# Patient Record
Sex: Male | Born: 1985 | Race: White | Hispanic: No | Smoking: Never smoker
Health system: Southern US, Community
[De-identification: ages and names within clinical notes are randomized; demographics above are authoritative.]

## PROBLEM LIST (undated history)

## (undated) DIAGNOSIS — F419 Anxiety disorder, unspecified: Secondary | ICD-10-CM

## (undated) DIAGNOSIS — E291 Testicular hypofunction: Secondary | ICD-10-CM

## (undated) HISTORY — PX: WISDOM TOOTH EXTRACTION: SHX21

## (undated) HISTORY — DX: Anxiety disorder, unspecified: F41.9

## (undated) HISTORY — PX: VASECTOMY: SHX75

## (undated) HISTORY — DX: Testicular hypofunction: E29.1

---

## 2008-04-24 ENCOUNTER — Encounter: Admission: RE | Admit: 2008-04-24 | Discharge: 2008-04-24 | Payer: Self-pay | Admitting: Internal Medicine

## 2017-08-13 ENCOUNTER — Ambulatory Visit (INDEPENDENT_AMBULATORY_CARE_PROVIDER_SITE_OTHER): Payer: PRIVATE HEALTH INSURANCE | Admitting: Endocrinology

## 2017-08-13 ENCOUNTER — Encounter: Payer: Self-pay | Admitting: Endocrinology

## 2017-08-13 DIAGNOSIS — E291 Testicular hypofunction: Secondary | ICD-10-CM

## 2017-08-13 DIAGNOSIS — N62 Hypertrophy of breast: Secondary | ICD-10-CM

## 2017-08-13 DIAGNOSIS — R7989 Other specified abnormal findings of blood chemistry: Secondary | ICD-10-CM | POA: Diagnosis not present

## 2017-08-13 LAB — IBC PANEL
IRON: 94 ug/dL (ref 42–165)
SATURATION RATIOS: 24.5 % (ref 20.0–50.0)
TRANSFERRIN: 274 mg/dL (ref 212.0–360.0)

## 2017-08-13 LAB — TSH: TSH: 1.07 u[IU]/mL (ref 0.35–4.50)

## 2017-08-13 LAB — LUTEINIZING HORMONE: LH: 2.92 m[IU]/mL (ref 1.50–9.30)

## 2017-08-13 NOTE — Patient Instructions (Addendum)
blood tests are requested for you today.  We'll let you know about the results. Testosterone treatment has risks, including increased or decreased fertility (depending on the type of treatment), hair loss, prostate cancer, benign prostate enlargement, blood clots, liver problems, lower hdl ("good cholesterol"), polycythemia (opposite of anemia), sleep apnea, and behavior changes.  Therefore, we have to weigh the risks and benefits of treatment.

## 2017-08-13 NOTE — Progress Notes (Signed)
Subjective:    Patient ID: Joshua Tanner, male    DOB: 01/15/1986, 31 y.o.   MRN: [id number]  HPI Pt is referred by Dr Dimas AguasHoward, for hypogonadism.  Pt reports he had puberty at the normal age.  He has 3 biological children.  He says he has never taken illicit androgens, but he once took "natural body builder" product.  In 2013, he was was noted to have testosterone of 200, and was ref to endocrinology.  The practice declined to see him, due to h/o bodybuilding.  He has not taken this product since then.  He has never been on any prescribed medication for hypogonadism.  He does not take antiandrogens or opioids.  He denies any h/o infertility, XRT, or genital infection.  He has never had surgery, or a serious injury to the head or genital area. He has no h/o sleep apnea or DVT.   He does not consume alcohol excessively.  He has moderate fatigue, and assoc mild depression.  He says he once weighed 325 lbs. He has had a vasectomy.   Past Medical History:  Diagnosis Date  . Hypogonadism in male     No past surgical history on file.  Social History   Socioeconomic History  . Marital status: Unknown    Spouse name: Not on file  . Number of children: Not on file  . Years of education: Not on file  . Highest education level: Not on file  Social Needs  . Financial resource strain: Not on file  . Food insecurity - worry: Not on file  . Food insecurity - inability: Not on file  . Transportation needs - medical: Not on file  . Transportation needs - non-medical: Not on file  Occupational History  . Not on file  Tobacco Use  . Smoking status: Never Smoker  . Smokeless tobacco: Current User    Types: Chew  Substance and Sexual Activity  . Alcohol use: Yes  . Drug use: No  . Sexual activity: Yes  Other Topics Concern  . Not on file  Social History Narrative  . Not on file    No current outpatient medications on file prior to visit.   No current facility-administered medications on file  prior to visit.     Allergies  Allergen Reactions  . Latex Rash    Rash & itching  . Codeine     Family History  Problem Relation Age of Onset  . Other Neg Hx        low testosterone    BP 118/86 (BP Location: Left Arm, Patient Position: Sitting, Cuff Size: Normal)   Pulse (!) 56   Wt 223 lb 12.8 oz (101.5 kg)   SpO2 98%     Review of Systems denies numbness, erectile dysfunction, decreased urinary stream, muscle weakness, fever, headache, easy bruising, sob, rash, diplopia, rhinorrhea, chest pain.  He reports decreased libido, gynecomastia, intermitt aggressive behavior, weight gain, and insomnia.       Objective:   Physical Exam VS: see vs page GEN: no distress HEAD: head: no deformity eyes: no periorbital swelling, no proptosis external nose and ears are normal mouth: no lesion seen NECK: supple, thyroid is not enlarged CHEST WALL: no deformity LUNGS: clear to auscultation BREASTS:  Mild bilat pseudogynecomastia CV: reg rate and rhythm, no murmur ABD: abdomen is soft, nontender.  no hepatosplenomegaly.  not distended.  no hernia GENITALIA:  Normal male.   MUSCULOSKELETAL: muscle bulk and strength are grossly normal.  no obvious joint swelling.  gait is normal and steady EXTEMITIES: no leg edema PULSES: no carotid bruit NEURO:  cn 2-12 grossly intact.   readily moves all 4's.  sensation is intact to touch on all 4's SKIN:  Normal texture and temperature.  No rash or suspicious lesion is visible.  Normal hair distribution NODES:  None palpable at the neck PSYCH: alert, well-oriented.  Does not appear anxious nor depressed.  I have reviewed outside records, and summarized: Pt was noted to have low testosterone, and referred here.  He was seen for wellness visit, and he was otherwise in good health.     Assessment & Plan:  Hypogonadism, new, uncertain etiology   Patient Instructions  blood tests are requested for you today.  We'll let you know about the  results. Testosterone treatment has risks, including increased or decreased fertility (depending on the type of treatment), hair loss, prostate cancer, benign prostate enlargement, blood clots, liver problems, lower hdl ("good cholesterol"), polycythemia (opposite of anemia), sleep apnea, and behavior changes.  Therefore, we have to weigh the risks and benefits of treatment.

## 2017-08-14 LAB — PROLACTIN: Prolactin: 5.6 ng/mL (ref 2.0–18.0)

## 2017-08-15 ENCOUNTER — Encounter: Payer: Self-pay | Admitting: Endocrinology

## 2017-08-15 DIAGNOSIS — E291 Testicular hypofunction: Secondary | ICD-10-CM | POA: Insufficient documentation

## 2017-08-15 LAB — TESTOSTERONE,FREE AND TOTAL
TESTOSTERONE FREE: 9.1 pg/mL (ref 8.7–25.1)
TESTOSTERONE: 407 ng/dL (ref 264–916)

## 2017-08-19 ENCOUNTER — Telehealth: Payer: Self-pay | Admitting: Endocrinology

## 2017-08-19 LAB — ESTRADIOL, FREE
Estradiol, Free: 0.31 pg/mL
Estradiol: 19 pg/mL

## 2017-08-19 NOTE — Telephone Encounter (Signed)
Patient is calling for the results of lab work

## 2017-08-20 ENCOUNTER — Encounter: Payer: Self-pay | Admitting: Endocrinology

## 2017-08-20 NOTE — Telephone Encounter (Signed)
Results were completed at almost midnight last night.  All are normal--good.  I hope you feel well.

## 2017-08-20 NOTE — Telephone Encounter (Signed)
Patient's wife keeps calling. The results haven't been released from last week that I see? Please advise?

## 2017-08-20 NOTE — Telephone Encounter (Signed)
The testosterone can fluctuate, but I am glad it is normal.  You should conclude that your symptoms are not from the testosterone.

## 2017-08-20 NOTE — Telephone Encounter (Signed)
Patient is concerned & doesn't understand the fluctuation in the tests. He would like to be advised on that & how is values could change so rapidly?

## 2017-08-20 NOTE — Telephone Encounter (Signed)
Malachi BondsGloria (wife) is concerned. Patient not feeling very well and they are still waiting for Lab results (not on MyChart yet). They are waiting for the results so Dr can treat patient. Please call ph# [phone] to let them know Lab results

## 2017-08-21 NOTE — Telephone Encounter (Signed)
Called patient & he wasn't please with the answer that he received. He said this is the second endocrinologist who doesn't want to help him or help him understand the fluctuations in his testosterone. He said that the will be seeking out a referral elsewhere.

## 2017-12-06 ENCOUNTER — Emergency Department (HOSPITAL_COMMUNITY)
Admission: EM | Admit: 2017-12-06 | Discharge: 2017-12-06 | Disposition: A | Discharge status: Home / Self Care | Payer: PRIVATE HEALTH INSURANCE | Attending: Emergency Medicine | Admitting: Emergency Medicine

## 2017-12-06 ENCOUNTER — Other Ambulatory Visit: Payer: Self-pay

## 2017-12-06 ENCOUNTER — Emergency Department (HOSPITAL_COMMUNITY): Payer: PRIVATE HEALTH INSURANCE

## 2017-12-06 ENCOUNTER — Encounter (HOSPITAL_COMMUNITY): Payer: Self-pay

## 2017-12-06 DIAGNOSIS — R1032 Left lower quadrant pain: Secondary | ICD-10-CM | POA: Diagnosis not present

## 2017-12-06 DIAGNOSIS — Z91040 Latex allergy status: Secondary | ICD-10-CM | POA: Diagnosis not present

## 2017-12-06 DIAGNOSIS — E860 Dehydration: Secondary | ICD-10-CM | POA: Diagnosis not present

## 2017-12-06 DIAGNOSIS — R1030 Lower abdominal pain, unspecified: Secondary | ICD-10-CM

## 2017-12-06 LAB — COMPREHENSIVE METABOLIC PANEL
ALT: 18 U/L (ref 17–63)
AST: 21 U/L (ref 15–41)
Albumin: 4.5 g/dL (ref 3.5–5.0)
Alkaline Phosphatase: 50 U/L (ref 38–126)
Anion gap: 10 (ref 5–15)
BILIRUBIN TOTAL: 0.8 mg/dL (ref 0.3–1.2)
BUN: 14 mg/dL (ref 6–20)
CHLORIDE: 101 mmol/L (ref 101–111)
CO2: 26 mmol/L (ref 22–32)
CREATININE: 1.52 mg/dL — AB (ref 0.61–1.24)
Calcium: 9.6 mg/dL (ref 8.9–10.3)
GFR calc Af Amer: >60 mL/min (ref >60)
GFR calc non Af Amer: 59 mL/min — ABNORMAL LOW (ref >60)
GLUCOSE: 83 mg/dL (ref 65–99)
Potassium: 4.8 mmol/L (ref 3.5–5.1)
Sodium: 137 mmol/L (ref 135–145)
Total Protein: 7 g/dL (ref 6.5–8.1)

## 2017-12-06 LAB — CBC WITH DIFFERENTIAL/PLATELET
BASOS ABS: 0 10*3/uL (ref 0.0–0.1)
Basophils Relative: 0 %
Eosinophils Absolute: 0 10*3/uL (ref 0.0–0.7)
Eosinophils Relative: 0 %
HEMATOCRIT: 44.2 % (ref 39.0–52.0)
Hemoglobin: 15.5 g/dL (ref 13.0–17.0)
LYMPHS PCT: 25 %
Lymphs Abs: 2 10*3/uL (ref 0.7–4.0)
MCH: 31.6 pg (ref 26.0–34.0)
MCHC: 35.1 g/dL (ref 30.0–36.0)
MCV: 90.2 fL (ref 78.0–100.0)
Monocytes Absolute: 0.7 10*3/uL (ref 0.1–1.0)
Monocytes Relative: 9 %
NEUTROS ABS: 5.2 10*3/uL (ref 1.7–7.7)
Neutrophils Relative %: 66 %
PLATELETS: 220 10*3/uL (ref 150–400)
RBC: 4.9 MIL/uL (ref 4.22–5.81)
RDW: 12.5 % (ref 11.5–15.5)
WBC: 7.9 10*3/uL (ref 4.0–10.5)

## 2017-12-06 LAB — URINALYSIS, ROUTINE W REFLEX MICROSCOPIC
Bacteria, UA: NONE SEEN
Bilirubin Urine: NEGATIVE
Glucose, UA: NEGATIVE mg/dL
Hgb urine dipstick: NEGATIVE
Ketones, ur: 5 mg/dL — AB
Leukocytes, UA: NEGATIVE
Nitrite: NEGATIVE
Protein, ur: 30 mg/dL — AB
Specific Gravity, Urine: 1.03 (ref 1.005–1.030)
pH: 5 (ref 5.0–8.0)

## 2017-12-06 LAB — LIPASE, BLOOD: Lipase: 28 U/L (ref 11–51)

## 2017-12-06 MED ORDER — SODIUM CHLORIDE 0.9 % IV BOLUS (SEPSIS): 1000.0000 mL | Freq: Once | INTRAVENOUS | Status: DC

## 2017-12-06 NOTE — ED Triage Notes (Signed)
PT to US

## 2017-12-06 NOTE — Discharge Instructions (Signed)
Continue with your antibiotics.  Take pain medication as needed.  Drink plenty of fluid.  Return if your condition worsen or if you have other concerns.

## 2017-12-06 NOTE — ED Provider Notes (Signed)
[facility] Provider Note   CSN: [id number] Arrival date & time: 12/06/17  1120     History   Chief Complaint Chief Complaint  Patient presents with  . Abdominal Pain    HPI Joshua Tanner is a [age] y.o. male.  HPI   32 year old male presenting for evaluation of abdominal pain.  Patient report for the past 4 days he has had progressive worsening lower abdominal pain.  He described pain as a stabbing sharp sensation that comes in waves and has become progressively worse.  Pain is worse last night while he was trying to sleep.  He noticed increasing pain after eating.  Endorsed nausea without vomiting.  He denies associated fever, chills, chest pain, shortness of breath, productive cough, dysuria, hematuria, penile discharge, or testicular pain.  He did mention the pain does sometimes radiates to his back.  He was initially seen by his PCP 3 days ago for his pain.  He was given antibiotic for potential diverticulitis without any extensive workup at that time.  Patient was prescribed Bactrim, and Flagyl.  He has been taking the medication without relief.  [facility] for his symptoms.  He mentioned that blood work and CT scan came back without any acute finding.  He was discharged home with Norco.  He did call reach out his PCP who recommended coming to the ER for further evaluation since his pain still persist.  Patient did mention noticing his urine having a darker color, and some odor.  But denies burning while urinating.  Patient otherwise without any significant past medical history.  Past Medical History:  Diagnosis Date  . Hypogonadism in male     Patient Active Problem List   Diagnosis Date Noted  . Hypogonadism in male   . Low testosterone 08/13/2017  . Gynecomastia 08/13/2017    History reviewed. No pertinent surgical history.     Home Medications    Prior to Admission medications   Not on  File    Family History Family History  Problem Relation Age of Onset  . Other Neg Hx        low testosterone    Social History Social History   Tobacco Use  . Smoking status: Never Smoker  . Smokeless tobacco: Current User    Types: Chew  Substance Use Topics  . Alcohol use: Yes  . Drug use: No     Allergies   Latex and Codeine   Review of Systems Review of Systems  All other systems reviewed and are negative.    Physical Exam Updated Vital Signs BP (!) 141/95 (BP Location: Right Arm)   Pulse 76   Temp 98.4 F (36.9 C) (Oral)   Resp 20   Ht 6\' 1"  (1.854 m)   Wt 99.8 kg (220 lb)   SpO2 100%   BMI 29.03 kg/m   Physical Exam  Constitutional: He appears well-developed and well-nourished. No distress.  HENT:  Head: Atraumatic.  Mouth/Throat: Oropharynx is clear and moist.  Eyes: Conjunctivae are normal.  Neck: Neck supple.  Cardiovascular: Regular rhythm.  Pulmonary/Chest: Effort normal and breath sounds normal. No respiratory distress. He exhibits no tenderness.  Abdominal: Soft. Normal appearance. There is tenderness in the left lower quadrant. There is no rebound, no guarding, no tenderness at McBurney's point and negative Murphy's sign. No hernia. Hernia confirmed negative in the right inguinal area and confirmed negative in the left inguinal area.  Genitourinary: Testes normal and penis normal. Cremasteric reflex is present. Right testis shows no swelling and no tenderness. Left testis shows no swelling and no tenderness.  Neurological: He is alert.  Skin: No rash noted.  Psychiatric: He has a normal mood and affect.  Nursing note and vitals reviewed.    ED Treatments / Results  Labs (all labs ordered are listed, but only abnormal results are displayed) Labs Reviewed  URINALYSIS, ROUTINE W REFLEX MICROSCOPIC - Abnormal; Notable for the following components:      Result Value   Color, Urine AMBER (*)    APPearance HAZY (*)    Ketones, ur 5 (*)      Protein, ur 30 (*)    Squamous Epithelial / LPF 0-5 (*)    All other components within normal limits  COMPREHENSIVE METABOLIC PANEL - Abnormal; Notable for the following components:   Creatinine, Ser 1.52 (*)    GFR calc non Af Amer 59 (*)    All other components within normal limits  CBC WITH DIFFERENTIAL/PLATELET  LIPASE, BLOOD    EKG  EKG Interpretation None       Radiology US Abdomen Limited  Result Date: 12/06/2017 CLINICAL DATA:  Acute right upper quadrant abdominal pain. EXAM: ULTRASOUND ABDOMEN LIMITED RIGHT UPPER QUADRANT COMPARISON:  None. FINDINGS: Gallbladder: No gallstones or wall thickening visualized. No sonographic Murphy sign noted by sonographer. Common bile duct: Diameter: 2 mm which is within normal limits. Liver: No focal lesion identified. Within normal limits in parenchymal echogenicity. Portal vein is patent on color Doppler imaging with normal direction of blood flow towards the liver. IMPRESSION: No abnormality seen in the right upper quadrant of the abdomen. Electronically Signed   By: Lupita Raider, M.D.   On: 12/06/2017 14:48    Procedures Procedures (including critical care time)  EMERGENCY DEPARTMENT  US GUIDANCE EXAM Emergency Ultrasound:  US Guidance for Needle Guidance  INDICATIONS: Difficult vascular access Linear probe used in real-time to visualize location of needle entry through skin.   PERFORMED BY: Myself IMAGES ARCHIVED?: No LIMITATIONS: Pain VIEWS USED: Transverse INTERPRETATION: Needle visualized within vein, Right arm and Needle gauge 20  Medications Ordered in ED Medications - No data to display   Initial Impression / Assessment and Plan / ED Course  I have reviewed the triage vital signs and the nursing notes.  Pertinent labs & imaging results that were available during my care of the patient were reviewed by me and considered in my medical decision making (see chart for details).     BP (!) 141/92   Pulse 76    Temp 98.4 F (36.9 C) (Oral)   Resp 20   Ht 6\' 1"  (1.854 m)   Wt 99.8 kg (220 lb)   SpO2 100%   BMI 29.03 kg/m    Final Clinical Impressions(s) / ED Diagnoses   Final diagnoses:  Lower abdominal pain  Dehydration    ED Discharge Orders    None     1:08 PM Patient here with lower abdominal pain for the past 4 days.  States that he was seen at an outside hospital for this condition early this morning, had blood work that was unremarkable and abdominal pelvic CT scan without any acute finding.  He is here at the recommendation of his PCP for further evaluation of his condition.  He was recently provided prophylactic treatment for potential diverticulitis which include Bactrim, and Flagyl.  He was also prescribed pain medication when discharged this a.m.  On exam patient does have some left lower quadrant tenderness without guarding.  No evidence of testicular pain or torsion noted.  No signs of hernia.  His UA shows no evidence of blood in the urine or signs of urinary tract infection.  He is afebrile, vital signs stable.  Plan to obtain records from outside hospital before determining plan of action.   1:52 PM Unable to access note from outside facility because they are closed today.  Will obtain labs, along with limited abdominal ultrasound since patient voiced concern for potential gallbladder disease and states that his family has a strong history of gallbladder problem.  If ultrasound negative, will consider abdominal pelvis CT scan with contrast to further management.  3:18 PM Last some mostly reassuring.  Creatinine is 1.52, urine shows 5 ketone.  This finding suggestive of mild dehydration.  I did offer IV fluids but patient prefers p.o. fluid instead.  Abdominal ultrasound without any acute concerning feature.  In the setting of renal impairment, I believe it is more harmful to obtain a CT with contrast.  After discussion with patient, patient felt comfortable going home continue  with current antibiotic treatment as well as pain medication that was previously prescribed.  He understands to return if his condition worsen and otherwise stable for discharge.   Fayrene Helper, PA-C 12/06/17 1520    Rolland Porter, MD 12/07/17 [phone]

## 2017-12-06 NOTE — ED Triage Notes (Signed)
Pt reports RLQ and LLQ pain that began on Friday where he was seen by PCP. Last night pain became so severe that he was doubled over in pain and [facility]. Pt states they did blood work and CT that came back normal. Pt states he spoke with PCP this morning who instructed him to come here for UA, US, hida scan. Denies vomiting or diarrhea. Last BM yesterday

## 2017-12-09 ENCOUNTER — Ambulatory Visit (INDEPENDENT_AMBULATORY_CARE_PROVIDER_SITE_OTHER): Payer: PRIVATE HEALTH INSURANCE | Admitting: Gastroenterology

## 2017-12-09 ENCOUNTER — Encounter: Payer: Self-pay | Admitting: Gastroenterology

## 2017-12-09 DIAGNOSIS — R109 Unspecified abdominal pain: Secondary | ICD-10-CM | POA: Insufficient documentation

## 2017-12-09 NOTE — Patient Instructions (Signed)
I have given you probiotic samples to take for the next 2-4 weeks. You can get probiotics over the counter such as digestive advantage, Philip's Colon Health, Align, and Restora (the last 2 are more expensive).  If pain recurs, call me. We will do further evaluation.  It was good to see you both!  It was a pleasure to see you today. I strive to create trusting relationships with patients to provide genuine, compassionate, and quality care. I value your feedback. If you receive a survey regarding your visit,  I greatly appreciate you taking time to fill this out.   Anna W. Boone, PhD, Gelene MinkANP-BC [facility]

## 2017-12-09 NOTE — Progress Notes (Signed)
Primary Care Physician:  Selinda Flavin, MD Primary Gastroenterologist:  Dr. Jena Gauss   Chief Complaint  Patient presents with  . Abdominal Pain    epigastric and below x last thurs, had soreness yesterday  . Nausea    no vomiting    HPI:   Joshua Tanner is a 32 y.o. male presenting today as a self referral due to abdominal pain. He was seen at PCP's office and prescribed empiric antibiotics for possible diverticulitis, with recommendations to seek emergent care if worsening. He went to [place] over the weekend and underwent a CT without contrast, which was unrevealing. Discharged from [facility] and presented to [facility] with unrevealing ultrasound and labs. Creatinine 1.52 on 12/06/17.   States last Thursday evening, he felt nauseated. Friday morning went to work and had worsening nausea, felt like he was coming down with a stomach bug. Saturday evening: [facility], Sunday morning: [facility]. Sunday evening, felt like he had undergone an extensive ab workout. Tuesday, sore. Nausea returned, laid down. Last few days have been better. Yesterday and today without any pain and able to eat absolutely everything. Nausea resolved. Last dose of antibiotics this evening. Pain was located at umbilicus to lower abdomen but progressed to diffuse midline pain entire abdomen. Eating made this worse. No NSAIDs. Has chronic lower back pain. No reflux. Rare burping. Feels gassy today and with looser stool but overall improved.     Past Medical History:  Diagnosis Date  . Anxiety   . Hypogonadism in male     Past Surgical History:  Procedure Laterality Date  . VASECTOMY    . WISDOM TOOTH EXTRACTION      Current Outpatient Medications  Medication Sig Dispense Refill  . buPROPion (WELLBUTRIN XL) 150 MG 24 hr tablet Take 150 mg by mouth daily.    Marland Kitchen HYDROcodone-acetaminophen (NORCO) 7.5-325 MG tablet Take 1 tablet by mouth 4 (four) times daily as needed for moderate pain.    . meclizine (ANTIVERT) 25 MG tablet  Take 25 mg by mouth 2 (two) times daily.    . metroNIDAZOLE (FLAGYL) 500 MG tablet Take 500 mg by mouth 2 (two) times daily.    Marland Kitchen sulfamethoxazole-trimethoprim (BACTRIM DS,SEPTRA DS) 800-160 MG tablet Take 1 tablet by mouth 2 (two) times daily.     No current facility-administered medications for this visit.     Allergies as of 12/09/2017 - Review Complete 12/09/2017  Allergen Reaction Noted  . Latex Rash 08/13/2017  . Codeine  08/13/2017    Family History  Problem Relation Age of Onset  . Other Neg Hx        low testosterone  . Colon cancer Neg Hx     Social History   Socioeconomic History  . Marital status: Unknown    Spouse name: Not on file  . Number of children: Not on file  . Years of education: Not on file  . Highest education level: Not on file  Social Needs  . Financial resource strain: Not on file  . Food insecurity - worry: Not on file  . Food insecurity - inability: Not on file  . Transportation needs - medical: Not on file  . Transportation needs - non-medical: Not on file  Occupational History  . Not on file  Tobacco Use  . Smoking status: Never Smoker  . Smokeless tobacco: Current User    Types: Chew  Substance and Sexual Activity  . Alcohol use: Yes    Comment: several beers in evenings on days off  .  Drug use: No  . Sexual activity: Yes  Other Topics Concern  . Not on file  Social History Narrative  . Not on file    Review of Systems: Gen: Denies any fever, chills, fatigue, weight loss, lack of appetite.  CV: Denies chest pain, heart palpitations, peripheral edema, syncope.  Resp: Denies shortness of breath at rest or with exertion. Denies wheezing or cough.  GI: see HPI  GU : Denies urinary burning, urinary frequency, urinary hesitancy MS: Denies joint pain, muscle weakness, cramps, or limitation of movement.  Derm: Denies rash, itching, dry skin Psych: Denies depression, anxiety, memory loss, and confusion Heme: Denies bruising, bleeding,  and enlarged lymph nodes.  Physical Exam: BP (!) 141/84   Pulse 73   Temp (!) 97.1 F (36.2 C) (Oral)   Ht 6\' 1"  (1.854 m)   Wt 228 lb (103.4 kg)   BMI 30.08 kg/m  General:   Alert and oriented. Pleasant and cooperative. Well-nourished and well-developed.  Head:  Normocephalic and atraumatic. Eyes:  Without icterus, sclera clear and conjunctiva pink.  Ears:  Normal auditory acuity. Mouth:  No deformity or lesions, oral mucosa pink.  Neck:  Supple, without mass or thyromegaly. Lungs:  Clear to auscultation bilaterally. No wheezes, rales, or rhonchi. No distress.  Heart:  S1, S2 present without murmurs appreciated.  Abdomen:  +BS, soft, non-tender and non-distended. No HSM noted. No guarding or rebound. No masses appreciated.  Rectal:  Deferred  Msk:  Symmetrical without gross deformities. Normal posture. Extremities:  Without edema. Neurologic:  Alert and  oriented x4 Psych:  Alert and cooperative. Normal mood and affect.  Lab Results  Component Value Date   WBC 7.9 12/06/2017   HGB 15.5 12/06/2017   HCT 44.2 12/06/2017   MCV 90.2 12/06/2017   PLT 220 12/06/2017   Lab Results  Component Value Date   ALT 18 12/06/2017   AST 21 12/06/2017   ALKPHOS 50 12/06/2017   BILITOT 0.8 12/06/2017   Lab Results  Component Value Date   CREATININE 1.52 (H) 12/06/2017   BUN 14 12/06/2017   NA 137 12/06/2017   K 4.8 12/06/2017   CL 101 12/06/2017   CO2 26 12/06/2017

## 2017-12-09 NOTE — Progress Notes (Signed)
CC'ED TO PCP 

## 2017-12-09 NOTE — Assessment & Plan Note (Signed)
32 year old male with acute onset of lower abdominal pain last week, associated nausea, with unrevealing CT (without contrast), labs, US abdomen. Pain now resolved. Empirically started on treatment for diverticulitis last week prior to [facility], and he will be finishing this course tonight. I doubt dealing with diverticulitis and low likelihood of biliary etiology. No significant symptoms such as chronic GERD or dyspepsia. If recurrent symptoms, he is to call us, and we will pursue either HIDA or CT with contrast depending on symptoms. Needs to have creatinine evaluated again with PCP due to bump, and this was normal in Jan 2019. Advised to follow-up with PCP regarding this. Call if any further issues. Samples of probiotics provided due to recent antibiotics. Return prn.

## 2018-12-30 ENCOUNTER — Encounter (INDEPENDENT_AMBULATORY_CARE_PROVIDER_SITE_OTHER): Payer: Self-pay | Admitting: Internal Medicine

## 2019-05-14 IMAGING — US US ABDOMEN LIMITED
1 series · 14 of 25 positions shown · non-contrast
Comparison: None.

CLINICAL DATA: Acute right upper quadrant abdominal pain.

EXAM:
ULTRASOUND ABDOMEN LIMITED RIGHT UPPER QUADRANT

[Series 1: us abdomen limited · 0.26mm/px · 14 of 29 slices shown]
[im 1/29]
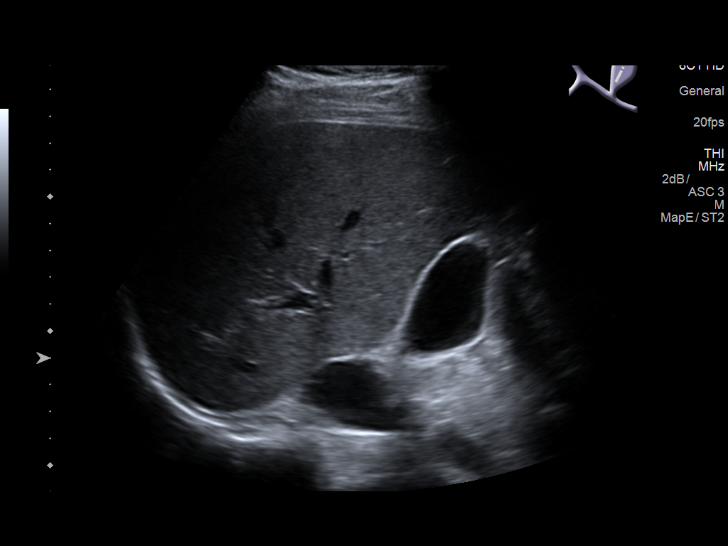
[im 3/29]
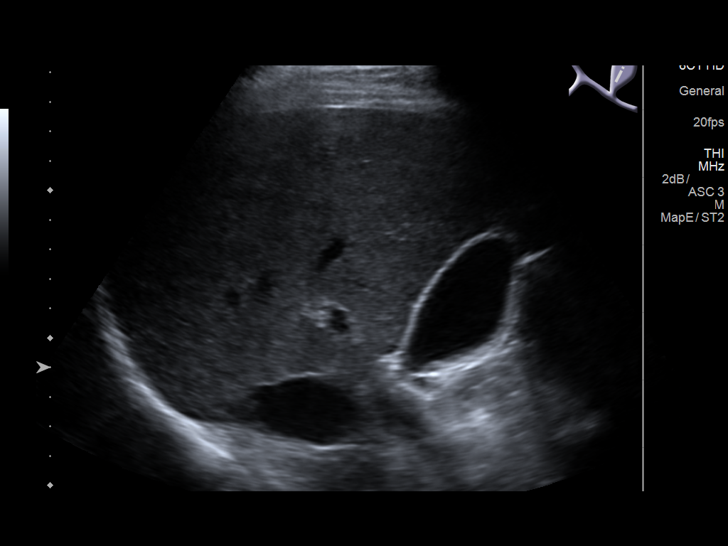
[im 5/29]
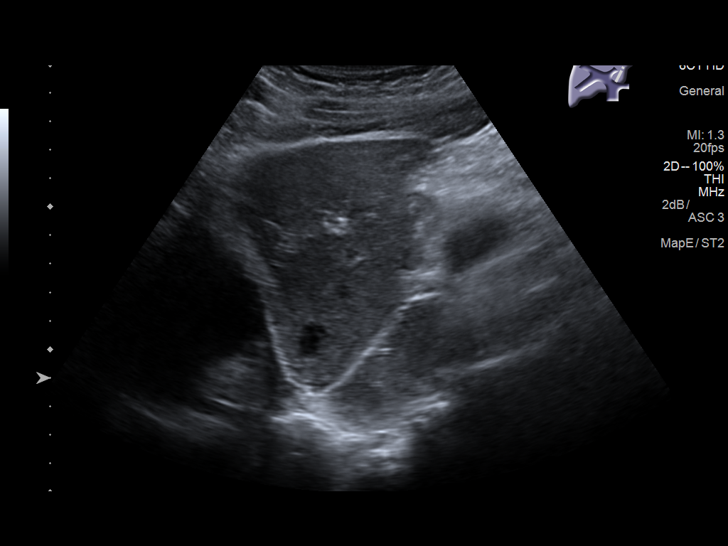
[im 8/29]
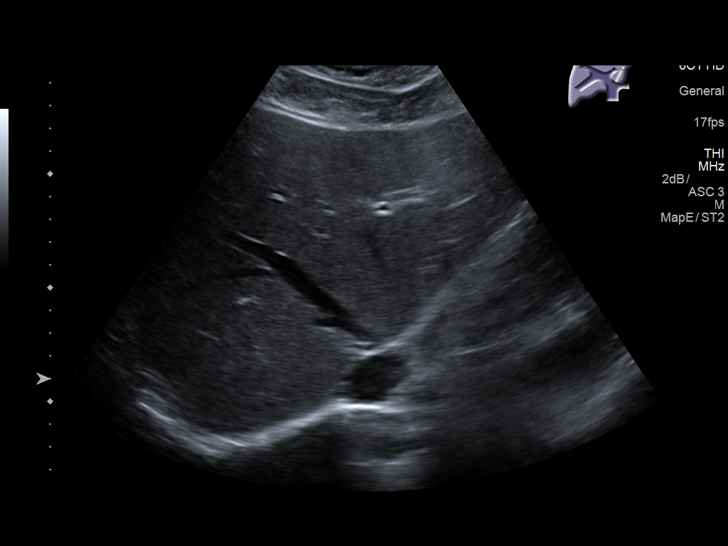
[im 10/29]
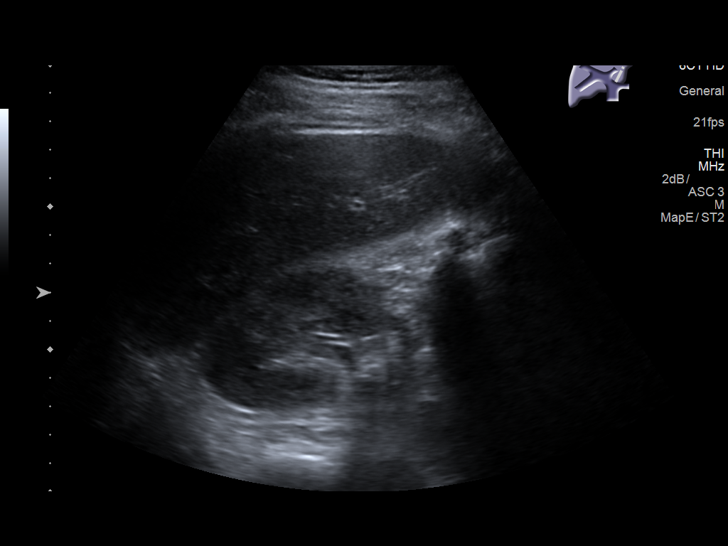
[im 11/29]
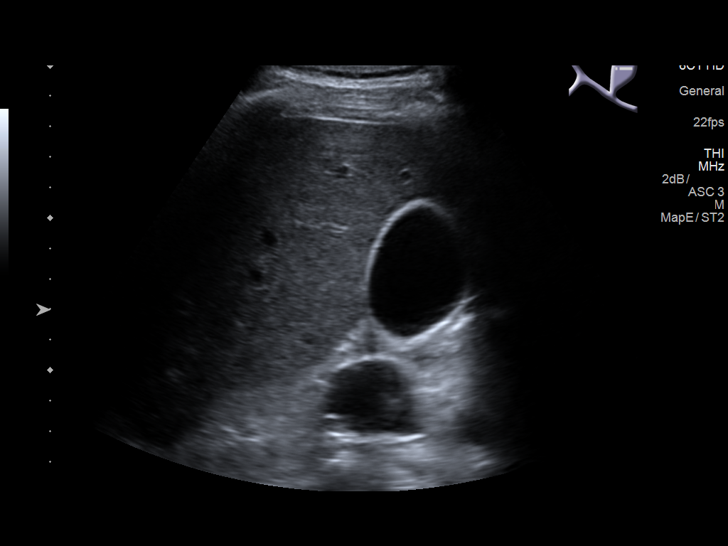
[im 13/29]
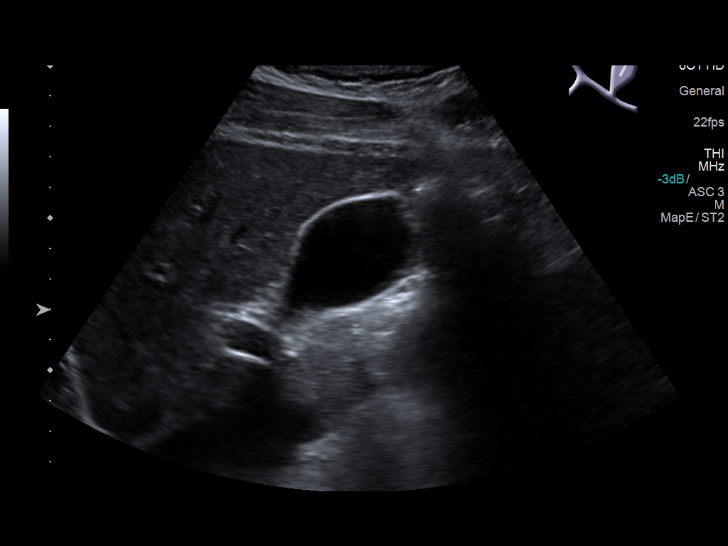
[im 16/29]
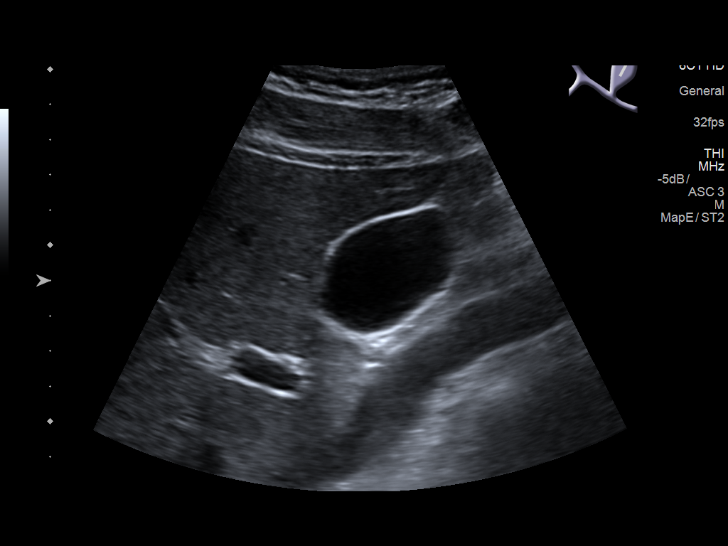
[im 18/29]
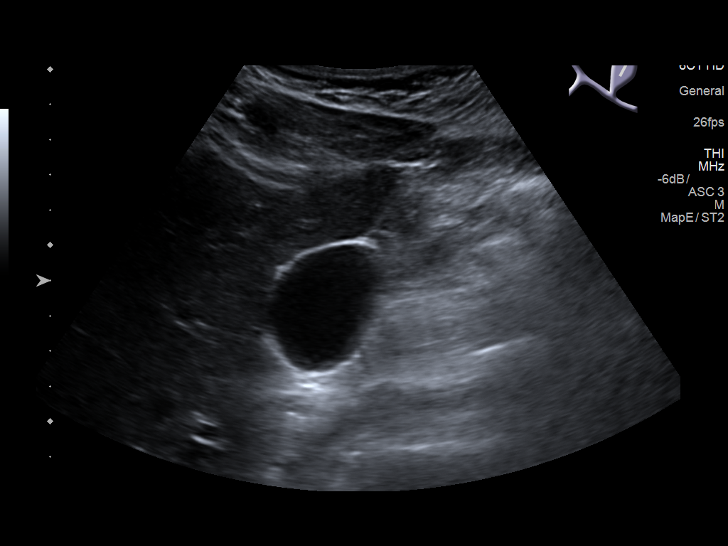
[im 19/29]
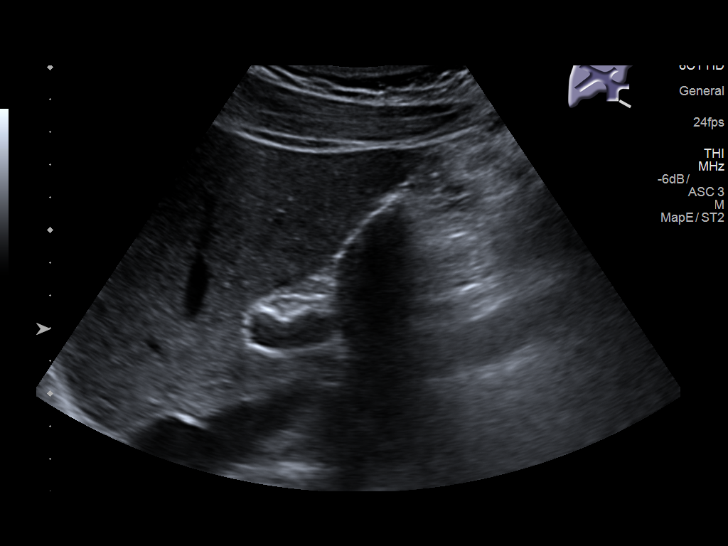
[im 22/29]
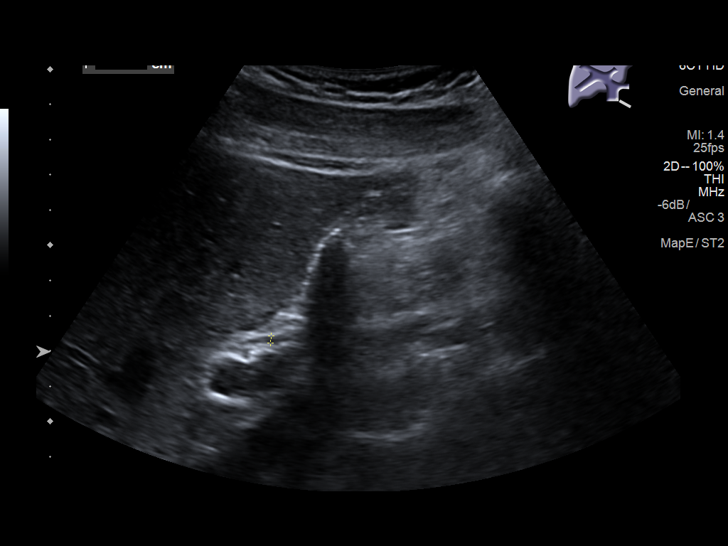
[im 24/29]
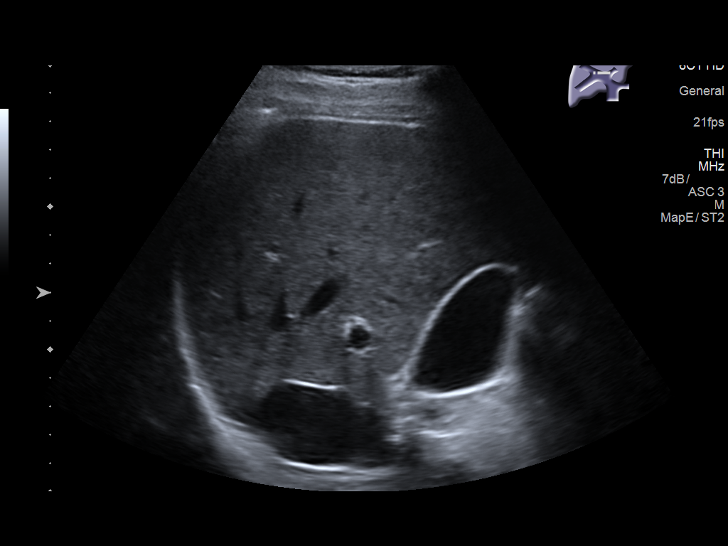
[im 26/29]
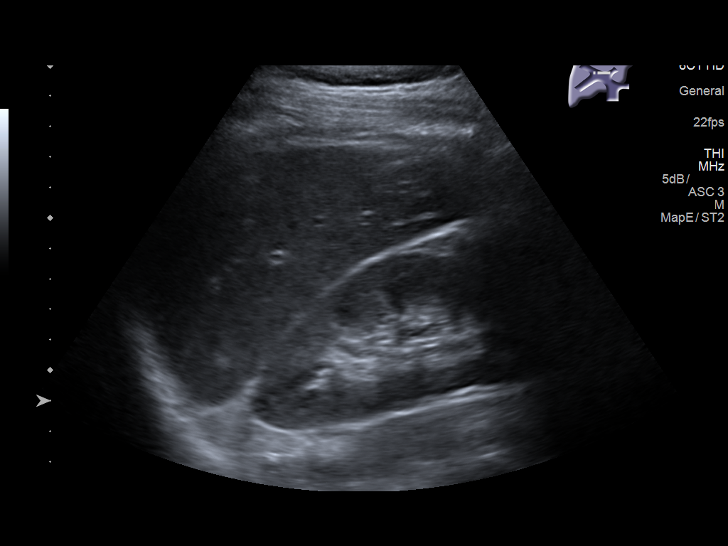
[im 29/29]
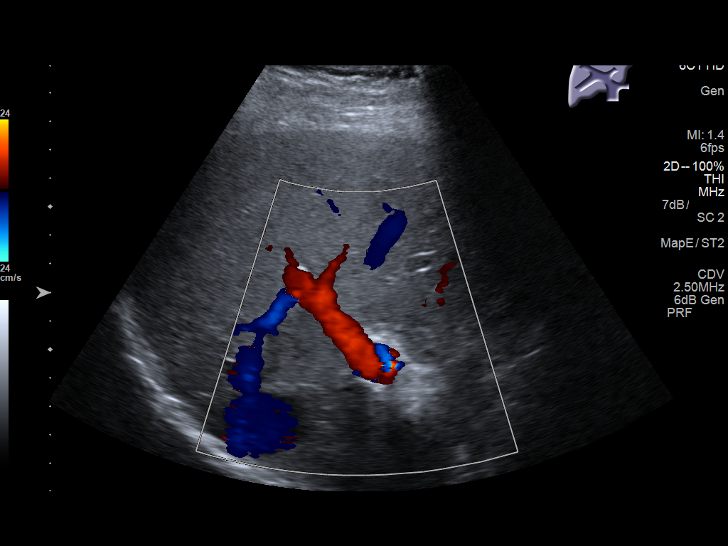

[14 of 25 positions shown; findings below may reference images not displayed]

FINDINGS: Gallbladder:

No gallstones or wall thickening visualized. No sonographic Murphy
sign noted by sonographer.

Common bile duct:

Diameter: 2 mm which is within normal limits.

Liver:

No focal lesion identified. Within normal limits in parenchymal
echogenicity. Portal vein is patent on color Doppler imaging with
normal direction of blood flow towards the liver.
IMPRESSION: No abnormality seen in the right upper quadrant of the abdomen.

## 2019-07-19 ENCOUNTER — Other Ambulatory Visit (INDEPENDENT_AMBULATORY_CARE_PROVIDER_SITE_OTHER): Payer: Self-pay | Admitting: Internal Medicine

## 2019-07-19 ENCOUNTER — Ambulatory Visit (INDEPENDENT_AMBULATORY_CARE_PROVIDER_SITE_OTHER): Payer: PRIVATE HEALTH INSURANCE | Admitting: Internal Medicine

## 2019-07-19 ENCOUNTER — Other Ambulatory Visit: Payer: Self-pay

## 2019-07-19 ENCOUNTER — Encounter (INDEPENDENT_AMBULATORY_CARE_PROVIDER_SITE_OTHER): Payer: Self-pay | Admitting: Internal Medicine

## 2019-07-19 VITALS — BP 120/80 | HR 72 | Ht 73.0 in | Wt 239.6 lb

## 2019-07-19 DIAGNOSIS — Z0001 Encounter for general adult medical examination with abnormal findings: Secondary | ICD-10-CM | POA: Diagnosis not present

## 2019-07-19 DIAGNOSIS — Z131 Encounter for screening for diabetes mellitus: Secondary | ICD-10-CM

## 2019-07-19 DIAGNOSIS — N62 Hypertrophy of breast: Secondary | ICD-10-CM

## 2019-07-19 DIAGNOSIS — F5101 Primary insomnia: Secondary | ICD-10-CM | POA: Diagnosis not present

## 2019-07-19 DIAGNOSIS — R5381 Other malaise: Secondary | ICD-10-CM

## 2019-07-19 DIAGNOSIS — R5383 Other fatigue: Secondary | ICD-10-CM

## 2019-07-19 DIAGNOSIS — E559 Vitamin D deficiency, unspecified: Secondary | ICD-10-CM

## 2019-07-19 DIAGNOSIS — E291 Testicular hypofunction: Secondary | ICD-10-CM

## 2019-07-19 DIAGNOSIS — Z13220 Encounter for screening for lipoid disorders: Secondary | ICD-10-CM

## 2019-07-19 DIAGNOSIS — Z125 Encounter for screening for malignant neoplasm of prostate: Secondary | ICD-10-CM

## 2019-07-19 MED ORDER — TESTOSTERONE CYPIONATE 200 MG/ML IJ SOLN: 100.0000 mg | INTRAMUSCULAR | 1 refills | Status: DC

## 2019-07-19 NOTE — Progress Notes (Signed)
Chief Complaint: This 33 year old man comes in for an annual physical exam and to address his conditions and symptoms which are described below. HPI: He has a longstanding history of low testosterone levels which have been associated with significantly reduced libido and virtually no energy.  He has been taking testosterone therapy for some time now and he does have significant improvement although he feels he is at a plateau at this present time. He also has difficulty with sleep and tends to have insomnia.  This is usually related to his schedule. He also previously has been diagnosed with gynecomastia and no obvious causes been found and no treatment instituted.  His prolactin levels have always been normal.  Past Medical History:  Diagnosis Date   Anxiety    Hypogonadism in male    Past Surgical History:  Procedure Laterality Date   VASECTOMY     WISDOM TOOTH EXTRACTION       Social History   Social History Narrative   Married for 13 years.Lives with wife and 3 kids.[employer] Guys since 2008.        Allergies:  Allergies  Allergen Reactions   Latex Rash    Rash & itching   Codeine      Current Meds  Medication Sig   Testosterone Cypionate 200 MG/ML SOLN Inject 100 mg as directed 2 (two) times a week.     Nutrition By his own admittance, nutrition is poor right now.  Sleep Not consistent.  Often interrupted sleep.  Exercise No regular exercise at the present time with a schedule.  Bio-identical hormones Testosterone therapy is being used off label for symptoms of testosterone deficiency and benefits that it produces based on several studies.  These benefits include decreasing body fat, increasing in lean muscle mass and increasing in bone density.  There is improvement of memory, cognition.  There is improvement in exercise tolerance and endurance.  Testosterone therapy has also been shown to be protective against coronary artery disease,  cerebrovascular disease, diabetes, hypertension and degenerative joint disease. I have discussed with the patient the FDA warnings regarding testosterone therapy, benefits and side effects and modes of administration as well as monitoring blood levels and side effects  on a regular basis The patient is agreeable that testosterone therapy should be an integral part of his/her wellness,quality of life and prevention of chronic disease.  PYP:PJKDT from the symptoms mentioned above,there are no other symptoms referable to all systems reviewed.  Physical Exam: Blood pressure 120/80, pulse 72, height 6\' 1"  (1.854 m), weight 239 lb 9.6 oz (108.7 kg). Vitals with BMI 07/19/2019 12/09/2017 12/06/2017  Height 6\' 1"  6\' 1"  -  Weight 239 lbs 10 oz 228 lbs -  BMI 26.71 24.58 -  Systolic 099 833 825  Diastolic 80 84 80  Pulse 72 73 49      He looks systemically well. General: Alert, cooperative, and appears to be the stated age.No pallor.  No jaundice.  No clubbing. Head: Normocephalic Eyes: Sclera white, pupils equal and reactive to light, red reflex x 2,  Ears: Normal bilaterally Oral cavity: Lips, mucosa, and tongue normal: Teeth and gums normal Neck: No adenopathy, supple, symmetrical, trachea midline, and thyroid does not appear enlarged Breast: Very mild gynecomastia. Respiratory: Clear to auscultation bilaterally.No wheezing, crackles or bronchial breathing. Cardiovascular: Heart sounds are present and appear to be normal without murmurs or added sounds.  No carotid bruits.  Peripheral pulses are present and equal bilaterally.: Gastrointestinal:positive bowel sounds, no hepatosplenomegaly.  No masses felt.No tenderness. Skin: Clear, No rashes noted.No worrisome skin lesions seen. Neurological: Grossly intact without focal findings, cranial nerves II through XII intact, muscle strength equal bilaterally Musculoskeletal: No acute joint abnormalities noted.Full range of movement noted with  joints. Psychiatric: Affect appropriate, non-anxious.    Assessment  1. Hypogonadism in male   2. Gynecomastia   3. Encounter for general adult medical examination with abnormal findings   4. Malaise and fatigue   5. Screening for diabetes mellitus   6. Screening for lipoid disorders   7. Special screening for malignant neoplasm of prostate   8. Vitamin D deficiency disease   9. Primary insomnia     Tests Ordered:   Orders Placed This Encounter  Procedures   CBC   COMPLETE METABOLIC PANEL WITH GFR   Hemoglobin A1c   Lipid panel   PSA   T3, free   TSH   Testosterone Total,Free,Bio, Males   VITAMIN D 25 Hydroxy (Vit-D Deficiency, Fractures)   Prolactin     Plan  1. He will continue with testosterone therapy twice a week and levels will help [place] see if we need to further increase the dose.  He last took an injection today morning. 2. I will check prolactin levels in terms of his gynecomastia again but I am not sure that anything needs to be done about the cosmetic appearance. 3. In terms of his insomnia, I recommended melatonin from [employer] to see if this will help him. 4. Blood work is ordered as above. 5. Further recommendations will depend on blood results and I will see him in about 3 months time for follow-up. 6. Today, in addition to a preventative visit, I performed an office visit to address his hypogonadism and gynecomastia above.     No orders of the defined types were placed in this encounter.    Hyatt Capobianco C Brittaney Beaulieu   07/19/2019, 9:30 AM

## 2019-07-19 NOTE — Patient Instructions (Signed)
[email or web address] Melatonin 1 mg capsules(#60)

## 2019-07-20 LAB — COMPLETE METABOLIC PANEL WITH GFR
AG Ratio: 1.8 (calc) (ref 1.0–2.5)
ALT: 14 U/L (ref 9–46)
AST: 18 U/L (ref 10–40)
Albumin: 4.6 g/dL (ref 3.6–5.1)
Alkaline phosphatase (APISO): 43 U/L (ref 36–130)
BUN: 11 mg/dL (ref 7–25)
CO2: 30 mmol/L (ref 20–32)
Calcium: 9.7 mg/dL (ref 8.6–10.3)
Chloride: 101 mmol/L (ref 98–110)
Creat: 1.25 mg/dL (ref 0.60–1.35)
GFR, Est African American: 87 mL/min/{1.73_m2} (ref >=60)
GFR, Est Non African American: 75 mL/min/{1.73_m2} (ref >=60)
Globulin: 2.6 g/dL (calc) (ref 1.9–3.7)
Glucose, Bld: 95 mg/dL (ref 65–99)
Potassium: 4.8 mmol/L (ref 3.5–5.3)
Sodium: 138 mmol/L (ref 135–146)
Total Bilirubin: 0.5 mg/dL (ref 0.2–1.2)
Total Protein: 7.2 g/dL (ref 6.1–8.1)

## 2019-07-20 LAB — TSH: TSH: 1.95 mIU/L (ref 0.40–4.50)

## 2019-07-20 LAB — LIPID PANEL
Cholesterol: 165 mg/dL (ref <200)
HDL: 66 mg/dL (ref >=40)
LDL Cholesterol (Calc): 84 mg/dL (calc)
Non-HDL Cholesterol (Calc): 99 mg/dL (calc) (ref <130)
Total CHOL/HDL Ratio: 2.5 (calc) (ref <5.0)
Triglycerides: 73 mg/dL (ref <150)

## 2019-07-20 LAB — PSA: PSA: 1.1 ng/mL (ref <=4.0)

## 2019-07-20 LAB — PROLACTIN: Prolactin: 9.4 ng/mL (ref 2.0–18.0)

## 2019-07-20 LAB — CBC
HCT: 49 % (ref 38.5–50.0)
Hemoglobin: 16.6 g/dL (ref 13.2–17.1)
MCH: 30.3 pg (ref 27.0–33.0)
MCHC: 33.9 g/dL (ref 32.0–36.0)
MCV: 89.6 fL (ref 80.0–100.0)
MPV: 10.8 fL (ref 7.5–12.5)
Platelets: 268 10*3/uL (ref 140–400)
RBC: 5.47 10*6/uL (ref 4.20–5.80)
RDW: 12.1 % (ref 11.0–15.0)
WBC: 6.1 10*3/uL (ref 3.8–10.8)

## 2019-07-20 LAB — TESTOSTERONE TOTAL,FREE,BIO, MALES
Albumin: 4.6 g/dL (ref 3.6–5.1)
Sex Hormone Binding: 27 nmol/L (ref 10–50)
Testosterone, Bioavailable: 514 ng/dL (ref >=110.0)
Testosterone, Free: 244.8 pg/mL — ABNORMAL HIGH (ref 46.0–224.0)
Testosterone: 1229 ng/dL — ABNORMAL HIGH (ref 250–827)

## 2019-07-20 LAB — HEMOGLOBIN A1C
Hgb A1c MFr Bld: 5.2 % of total Hgb (ref <5.7)
Mean Plasma Glucose: 103 (calc)
eAG (mmol/L): 5.7 (calc)

## 2019-07-20 LAB — T3, FREE: T3, Free: 4 pg/mL (ref 2.3–4.2)

## 2019-07-20 LAB — VITAMIN D 25 HYDROXY (VIT D DEFICIENCY, FRACTURES): Vit D, 25-Hydroxy: 37 ng/mL (ref 30–100)

## 2019-07-26 ENCOUNTER — Other Ambulatory Visit (INDEPENDENT_AMBULATORY_CARE_PROVIDER_SITE_OTHER): Payer: Self-pay | Admitting: Internal Medicine

## 2019-09-19 ENCOUNTER — Other Ambulatory Visit (INDEPENDENT_AMBULATORY_CARE_PROVIDER_SITE_OTHER): Payer: Self-pay | Admitting: Internal Medicine

## 2019-09-19 ENCOUNTER — Telehealth (INDEPENDENT_AMBULATORY_CARE_PROVIDER_SITE_OTHER): Payer: Self-pay | Admitting: Internal Medicine

## 2019-09-19 MED ORDER — TESTOSTERONE CYPIONATE 200 MG/ML IJ SOLN: 100.0000 mg | INTRAMUSCULAR | 1 refills | Status: DC

## 2019-09-19 NOTE — Telephone Encounter (Signed)
Done

## 2019-10-24 ENCOUNTER — Ambulatory Visit (INDEPENDENT_AMBULATORY_CARE_PROVIDER_SITE_OTHER): Payer: PRIVATE HEALTH INSURANCE | Admitting: Internal Medicine

## 2019-10-31 ENCOUNTER — Other Ambulatory Visit (INDEPENDENT_AMBULATORY_CARE_PROVIDER_SITE_OTHER): Payer: Self-pay

## 2019-10-31 DIAGNOSIS — E291 Testicular hypofunction: Secondary | ICD-10-CM

## 2019-10-31 MED ORDER — TESTOSTERONE CYPIONATE 200 MG/ML IJ SOLN: 100.0000 mg | INTRAMUSCULAR | 1 refills | Status: DC

## 2019-10-31 NOTE — Addendum Note (Signed)
Addended by: Lacie Scotts on: 10/31/2019 02:04 PM   Modules accepted: Orders

## 2019-10-31 NOTE — Telephone Encounter (Signed)
Please call Testosterone to [facility]

## 2019-11-16 ENCOUNTER — Other Ambulatory Visit: Payer: Self-pay

## 2019-11-16 ENCOUNTER — Ambulatory Visit (INDEPENDENT_AMBULATORY_CARE_PROVIDER_SITE_OTHER): Payer: PRIVATE HEALTH INSURANCE | Admitting: Internal Medicine

## 2019-11-16 ENCOUNTER — Encounter (INDEPENDENT_AMBULATORY_CARE_PROVIDER_SITE_OTHER): Payer: Self-pay | Admitting: Internal Medicine

## 2019-11-16 VITALS — BP 140/80 | HR 84 | Temp 99.4°F | Resp 18 | Ht 73.0 in | Wt 242.6 lb

## 2019-11-16 DIAGNOSIS — E559 Vitamin D deficiency, unspecified: Secondary | ICD-10-CM | POA: Diagnosis not present

## 2019-11-16 DIAGNOSIS — E291 Testicular hypofunction: Secondary | ICD-10-CM

## 2019-11-16 DIAGNOSIS — R5383 Other fatigue: Secondary | ICD-10-CM | POA: Diagnosis not present

## 2019-11-16 DIAGNOSIS — R5381 Other malaise: Secondary | ICD-10-CM

## 2019-11-16 MED ORDER — TESTOSTERONE CYPIONATE 200 MG/ML IM SOLN: 140.0000 mg | INTRAMUSCULAR | 1 refills | Status: DC

## 2019-11-16 MED ORDER — TESTOSTERONE CYPIONATE 200 MG/ML IM SOLN: 140.0000 mg | INTRAMUSCULAR | 0 refills | Status: DC

## 2019-11-16 NOTE — Progress Notes (Signed)
Metrics: Intervention Frequency ACO  Documented Smoking Status Yearly  Screened one or more times in 24 months  Cessation Counseling or  Active cessation medication Past 24 months  Past 24 months   Guideline developer: UpToDate (See UpToDate for funding source) Date Released: 2014       Wellness Office Visit  Subjective:  Patient ID: Joshua Tanner, male    DOB: April 07, 1986  Age: 34 y.o. MRN: [id number]  CC: This man comes in for follow-up regarding his hypogonadism and vitamin D deficiency. HPI  Unfortunately, he has not been taking the dose of vitamin D3 that I anticipated.  I think he is taking vitamin D3 5000 units daily. I did increase his testosterone dose on the last visit and although he did feel better for a while, he is now reached a plateau again.  He did tolerate the higher dose without side effects. He is currently undergoing chiropractic care because of back pain. Past Medical History:  Diagnosis Date  . Anxiety   . Hypogonadism in male       Family History  Problem Relation Age of Onset  . Obesity Mother   . Diabetes Father   . Obesity Father   . Hypertension Father   . Other Neg Hx        low testosterone  . Colon cancer Neg Hx     Social History   Social History Narrative   Married for 13 years.Lives with wife and 3 kids.[name] since 2008.   Social History   Tobacco Use  . Smoking status: Never Smoker  . Smokeless tobacco: Current User    Types: Chew  Substance Use Topics  . Alcohol use: Yes    Alcohol/week: 12.0 standard drinks    Types: 12 Standard drinks or equivalent per week    Current Meds  Medication Sig  . Testosterone Cypionate 200 MG/ML SOLN Inject 100 mg as directed 2 (two) times a week.     Objective:   Today's Vitals: BP 140/80 (BP Location: Right Arm, Patient Position: Sitting, Cuff Size: Normal)   Pulse 84   Temp 99.4 F (37.4 C) (Temporal)   Resp 18   Ht 6\' 1"  (1.854 m)   Wt 242 lb 9.6 oz (110 kg)   SpO2  95%   BMI 32.01 kg/m  Vitals with BMI 11/16/2019 07/19/2019 12/09/2017  Height 6\' 1"  6\' 1"  6\' 1"   Weight 242 lbs 10 oz 239 lbs 10 oz 228 lbs  BMI 32.01 31.62 30.09  Systolic 140 120 02/08/2018  Diastolic 80 80 84  Pulse 84 72 73     Physical Exam  He looks systemically well.  Systolic blood pressure slightly elevated today.  He has gained few pounds in weight but otherwise looks well.     Assessment   1. Hypogonadism in male   2. Malaise and fatigue   3. Vitamin D deficiency disease       Tests ordered No orders of the defined types were placed in this encounter.    Plan: 1. I recommended that he increase the dose of testosterone so he is going to be taking 140 mg intramuscularly twice a week. 2. .  I stressed the importance of taking vitamin D3 10,000 units daily. 3. We also discussed COVID-19 vaccination and explained the studies regarding this. 4. Today I spent 30 minutes with this patient regarding his testosterone therapy, vitamin D and Covid 19 vaccination. 5. Follow-up in 2 months when we will do blood work.  Meds ordered this encounter  Medications  . testosterone cypionate (DEPO-TESTOSTERONE) 200 MG/ML injection    Sig: Inject 0.7 mLs (140 mg total) into the muscle every 7 (seven) days.    Dispense:  10 mL    Refill:  1  . testosterone cypionate (DEPO-TESTOSTERONE) 200 MG/ML injection    Sig: Inject 0.7 mLs (140 mg total) into the muscle 2 (two) times a week. Please ignore previous prescription that stated once a week.    Dispense:  10 mL    Refill:  0    Jerrad Mendibles Luther Parody, MD

## 2020-01-09 ENCOUNTER — Other Ambulatory Visit (INDEPENDENT_AMBULATORY_CARE_PROVIDER_SITE_OTHER): Payer: Self-pay | Admitting: Internal Medicine

## 2020-01-09 ENCOUNTER — Telehealth (INDEPENDENT_AMBULATORY_CARE_PROVIDER_SITE_OTHER): Payer: Self-pay

## 2020-01-09 DIAGNOSIS — E291 Testicular hypofunction: Secondary | ICD-10-CM

## 2020-01-09 MED ORDER — TESTOSTERONE CYPIONATE 200 MG/ML IM SOLN: 140.0000 mg | INTRAMUSCULAR | 1 refills | Status: DC

## 2020-01-09 MED ORDER — TESTOSTERONE CYPIONATE 200 MG/ML IJ SOLN: 100.0000 mg | INTRAMUSCULAR | 1 refills | Status: DC

## 2020-01-09 NOTE — Telephone Encounter (Signed)
LeighAnn Lyndall Windt, CMA  

## 2020-02-08 ENCOUNTER — Ambulatory Visit (INDEPENDENT_AMBULATORY_CARE_PROVIDER_SITE_OTHER): Payer: PRIVATE HEALTH INSURANCE | Admitting: Internal Medicine

## 2020-02-08 ENCOUNTER — Encounter (INDEPENDENT_AMBULATORY_CARE_PROVIDER_SITE_OTHER): Payer: Self-pay | Admitting: Internal Medicine

## 2020-02-08 ENCOUNTER — Other Ambulatory Visit: Payer: Self-pay

## 2020-02-08 ENCOUNTER — Other Ambulatory Visit (INDEPENDENT_AMBULATORY_CARE_PROVIDER_SITE_OTHER): Payer: Self-pay | Admitting: Internal Medicine

## 2020-02-08 VITALS — BP 130/90 | HR 67 | Temp 98.1°F | Ht 73.0 in | Wt 239.2 lb

## 2020-02-08 DIAGNOSIS — E559 Vitamin D deficiency, unspecified: Secondary | ICD-10-CM | POA: Diagnosis not present

## 2020-02-08 DIAGNOSIS — E291 Testicular hypofunction: Secondary | ICD-10-CM | POA: Diagnosis not present

## 2020-02-08 NOTE — Progress Notes (Signed)
Metrics: Intervention Frequency ACO  Documented Smoking Status Yearly  Screened one or more times in 24 months  Cessation Counseling or  Active cessation medication Past 24 months  Past 24 months   Guideline developer: UpToDate (See UpToDate for funding source) Date Released: 2014       Wellness Office Visit  Subjective:  Patient ID: Joshua Tanner, male    DOB: [birth year]  Age: 34 y.o. MRN: [id number]  CC: This man comes in for follow-up of hypogonadism and vitamin D deficiency. HPI  On the last visit, I increased his testosterone to 0.7 mL twice a week and this has made him feel much better, even better than when he first tried taking testosterone therapy. He also has been consistent and compliant with taking vitamin D3 10,000 units daily. He continues to be busy at work and experiences episodes of stress with feelings of almost depression intermittently.  He is never suicidal. Past Medical History:  Diagnosis Date  . Anxiety   . Hypogonadism in male       Family History  Problem Relation Age of Onset  . Obesity Mother   . Diabetes Father   . Obesity Father   . Hypertension Father   . Other Neg Hx        low testosterone  . Colon cancer Neg Hx     Social History   Social History Narrative   Married for 13 years.Lives with wife and 3 kids.[employer] Gu[employer] since 2008.   Social History   Tobacco Use  . Smoking status: Never Smoker  . Smokeless tobacco: Current User    Types: Chew  Substance Use Topics  . Alcohol use: Yes    Alcohol/week: 12.0 standard drinks    Types: 12 Standard drinks or equivalent per week    Current Meds  Medication Sig  . Cholecalciferol (VITAMIN D3) 1.25 MG (50000 UT) CAPS Take 10,000 Units by mouth daily.  . Multiple Vitamin (MULTIVITAMIN ADULT PO) Take by mouth daily.  . Omega-3 Fatty Acids (FISH OIL) 1000 MG CAPS Take 1,000 mg by mouth daily.  Marland Kitchen testosterone cypionate (DEPO-TESTOSTERONE) 200 MG/ML injection Inject 0.7 mLs (140 mg  total) into the muscle 2 (two) times a week. Please ignore previous prescription that stated once a week.  . [DISCONTINUED] testosterone cypionate (DEPO-TESTOSTERONE) 200 MG/ML injection Inject 0.7 mLs (140 mg total) into the muscle every 7 (seven) days.  . [DISCONTINUED] Testosterone Cypionate 200 MG/ML SOLN Inject 100 mg as directed 2 (two) times a week.          Objective:   Today's Vitals: BP 130/90 (BP Location: Left Arm, Patient Position: Sitting, Cuff Size: Normal)   Pulse 67   Temp 98.1 F (36.7 C) (Temporal)   Ht 6\' 1"  (1.854 m)   Wt 239 lb 3.2 oz (108.5 kg)   SpO2 97%   BMI 31.56 kg/m  Vitals with BMI 02/08/2020 11/16/2019 07/19/2019  Height 6\' 1"  6\' 1"  6\' 1"   Weight 239 lbs 3 oz 242 lbs 10 oz 239 lbs 10 oz  BMI 31.57 50.09 38.18  Systolic 299 371 696  Diastolic 90 80 80  Pulse 67 84 72     Physical Exam   No systemically well.  He has lost a few pounds since the last visit but remains obese.  He is quite muscular.  His diastolic blood pressure is elevated.    Assessment   1. Hypogonadism in male   2. Vitamin D deficiency disease  Tests ordered Orders Placed This Encounter  Procedures  . COMPLETE METABOLIC PANEL WITH GFR  . VITAMIN D 25 Hydroxy (Vit-D Deficiency, Fractures)  . Testosterone Total,Free,Bio, Males     Plan: 1. Blood work is ordered. 2. He will continue with testosterone therapy as before and I suspect this is a good dose for him now so that we will not need to adjust further.  He last had an injection about 36 hours ago. 3. He will continue with vitamin D3 10,000 units daily and we will check levels today. 4. Today we also discussed the importance of cardiovascular fitness and he is really not engaged much in this so far and I encouraged him to do so.  Of course, the most scientific way to help him would be to get a VO2 max test and I suggested this to him and he will think about it.  In the meantime, he can certainly do a modified  interval training program on the treadmill.  We also discussed this. 5. I will see him in about 5 months time for his annual physical exam.  Today I spent 30 minutes with this patient discussing testosterone and possible side effects as well as exercise above.   No orders of the defined types were placed in this encounter.   Wilson Singer, MD

## 2020-02-09 LAB — TESTOSTERONE TOTAL,FREE,BIO, MALES
Albumin: 4.6 g/dL (ref 3.6–5.1)
Sex Hormone Binding: 25 nmol/L (ref 10–50)
Testosterone, Bioavailable: 834.4 ng/dL — ABNORMAL HIGH (ref >=110.0)
Testosterone, Free: 397.4 pg/mL — ABNORMAL HIGH (ref 46.0–224.0)
Testosterone: 1670 ng/dL — ABNORMAL HIGH (ref 250–827)

## 2020-02-09 LAB — COMPLETE METABOLIC PANEL WITHOUT GFR
AG Ratio: 1.8 (calc) (ref 1.0–2.5)
ALT: 23 U/L (ref 9–46)
AST: 23 U/L (ref 10–40)
Albumin: 4.6 g/dL (ref 3.6–5.1)
Alkaline phosphatase (APISO): 37 U/L (ref 36–130)
BUN: 12 mg/dL (ref 7–25)
CO2: 27 mmol/L (ref 20–32)
Calcium: 9.7 mg/dL (ref 8.6–10.3)
Chloride: 102 mmol/L (ref 98–110)
Creat: 1.2 mg/dL (ref 0.60–1.35)
GFR, Est African American: 91 mL/min/1.73m2
GFR, Est Non African American: 78 mL/min/1.73m2
Globulin: 2.5 g/dL (ref 1.9–3.7)
Glucose, Bld: 86 mg/dL (ref 65–99)
Potassium: 4.7 mmol/L (ref 3.5–5.3)
Sodium: 139 mmol/L (ref 135–146)
Total Bilirubin: 0.6 mg/dL (ref 0.2–1.2)
Total Protein: 7.1 g/dL (ref 6.1–8.1)

## 2020-02-09 LAB — EXTRA LAV TOP TUBE

## 2020-02-09 LAB — VITAMIN D 25 HYDROXY (VIT D DEFICIENCY, FRACTURES): Vit D, 25-Hydroxy: 72 ng/mL (ref 30–100)

## 2020-02-14 ENCOUNTER — Telehealth (INDEPENDENT_AMBULATORY_CARE_PROVIDER_SITE_OTHER): Payer: Self-pay

## 2020-02-14 ENCOUNTER — Other Ambulatory Visit (INDEPENDENT_AMBULATORY_CARE_PROVIDER_SITE_OTHER): Payer: Self-pay | Admitting: Internal Medicine

## 2020-02-14 MED ORDER — TESTOSTERONE CYPIONATE 200 MG/ML IM SOLN: 140.0000 mg | INTRAMUSCULAR | 2 refills | Status: DC

## 2020-02-14 NOTE — Telephone Encounter (Signed)
Joshua Tanner is calling asking for a refill on his Testosterone sent to [facility], please advise?

## 2020-04-02 ENCOUNTER — Other Ambulatory Visit (INDEPENDENT_AMBULATORY_CARE_PROVIDER_SITE_OTHER): Payer: Self-pay

## 2020-04-02 MED ORDER — TESTOSTERONE CYPIONATE 200 MG/ML IM SOLN: 140.0000 mg | INTRAMUSCULAR | 2 refills | Status: DC

## 2020-05-16 ENCOUNTER — Other Ambulatory Visit (INDEPENDENT_AMBULATORY_CARE_PROVIDER_SITE_OTHER): Payer: Self-pay

## 2020-05-16 MED ORDER — TESTOSTERONE CYPIONATE 200 MG/ML IM SOLN: 140.0000 mg | INTRAMUSCULAR | 2 refills | Status: DC

## 2020-07-09 ENCOUNTER — Encounter (INDEPENDENT_AMBULATORY_CARE_PROVIDER_SITE_OTHER): Payer: Self-pay | Admitting: Internal Medicine

## 2020-07-09 ENCOUNTER — Ambulatory Visit (HOSPITAL_COMMUNITY)
Admission: RE | Admit: 2020-07-09 | Discharge: 2020-07-09 | Disposition: A | Discharge status: Home / Self Care | Payer: PRIVATE HEALTH INSURANCE | Source: Ambulatory Visit | Attending: Internal Medicine | Admitting: Internal Medicine

## 2020-07-09 ENCOUNTER — Other Ambulatory Visit: Payer: Self-pay

## 2020-07-09 ENCOUNTER — Ambulatory Visit (INDEPENDENT_AMBULATORY_CARE_PROVIDER_SITE_OTHER): Payer: PRIVATE HEALTH INSURANCE | Admitting: Internal Medicine

## 2020-07-09 VITALS — BP 132/70 | HR 78 | Temp 97.7°F | Resp 18 | Ht 73.0 in | Wt 221.6 lb

## 2020-07-09 DIAGNOSIS — E559 Vitamin D deficiency, unspecified: Secondary | ICD-10-CM

## 2020-07-09 DIAGNOSIS — M545 Low back pain, unspecified: Secondary | ICD-10-CM | POA: Diagnosis not present

## 2020-07-09 DIAGNOSIS — E291 Testicular hypofunction: Secondary | ICD-10-CM | POA: Diagnosis not present

## 2020-07-09 DIAGNOSIS — Z125 Encounter for screening for malignant neoplasm of prostate: Secondary | ICD-10-CM | POA: Diagnosis not present

## 2020-07-09 DIAGNOSIS — Z0001 Encounter for general adult medical examination with abnormal findings: Secondary | ICD-10-CM

## 2020-07-09 NOTE — Progress Notes (Signed)
Chief Complaint: This 34 year old man comes in for an annual physical exam and to address his chronic conditions which are described below. HPI: He is on testosterone therapy twice a week for hypogonadism and higher dose has made him feel better from the last time. He continues on vitamin D3 supplementation for vitamin D deficiency. He has been working on nutrition and has been doing some intermittent fasting and as a result has lost further weight. Today is complaining of low back pain which she has had for about the last 7 to 10 days.  He denies any specific trauma.  It seems to be more on the right lower back but does not go down his legs.  Past Medical History:  Diagnosis Date  . Anxiety   . Hypogonadism in male    Past Surgical History:  Procedure Laterality Date  . VASECTOMY    . 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    Social History   Tobacco Use  . Smoking status: Never Smoker  . Smokeless tobacco: Current User    Types: Chew  Substance Use Topics  . Alcohol use: Yes    Alcohol/week: 18.0 standard drinks    Types: 18 Cans of beer per week      Allergies:  Allergies  Allergen Reactions  . Latex Rash    Rash & itching  . Codeine      Current Meds  Medication Sig  . Cholecalciferol (VITAMIN D3) 1.25 MG (50000 UT) CAPS Take 10,000 Units by mouth daily.  . Multiple Vitamin (MULTIVITAMIN ADULT PO) Take by mouth daily.  . Omega-3 Fatty Acids (FISH OIL) 1000 MG CAPS Take 1,000 mg by mouth daily.  Marland Kitchen testosterone cypionate (DEPO-TESTOSTERONE) 200 MG/ML injection Inject 0.7 mLs (140 mg total) into the muscle 2 (two) times a week. Please ignore previous prescription that stated once a week. (Patient taking differently: Inject 160 mg into the muscle 2 (two) times a week. Please ignore previous prescription that stated  once a week.)       No flowsheet data found.   ZWC:HENID from the symptoms mentioned above,there are no other symptoms referable to all systems reviewed.       Physical Exam: Blood pressure 132/70, pulse 78, temperature 97.7 F (36.5 C), temperature source Temporal, resp. rate 18, height 6\' 1"  (1.854 m), weight 221 lb 9.6 oz (100.5 kg), SpO2 98 %. Vitals with BMI 07/09/2020 02/08/2020 11/16/2019  Height 6\' 1"  6\' 1"  6\' 1"   Weight 221 lbs 10 oz 239 lbs 3 oz 242 lbs 10 oz  BMI 29.24 31.57 32.01  Systolic 132 130 01/14/2020  Diastolic 70 90 80  Pulse 78 67 84      He looks systemically well.  He is now overweight and not obese and he has lost 18 pounds since last time I saw him.  His blood pressure also has improved. General: Alert, cooperative, and appears to be the stated age.No pallor.  No jaundice.  No clubbing. Head: Normocephalic Eyes: Sclera white, pupils equal and reactive to light, red reflex x 2,  Ears: Normal bilaterally Oral cavity: Lips, mucosa, and tongue normal: Teeth and gums normal Neck: No adenopathy, supple, symmetrical, trachea midline, and thyroid does not appear enlarged Respiratory: Clear to auscultation bilaterally.No wheezing, crackles or bronchial breathing. Cardiovascular: Heart sounds are present and appear to  be normal without murmurs or added sounds.  No carotid bruits.  Peripheral pulses are present and equal bilaterally.: Gastrointestinal:positive bowel sounds, no hepatosplenomegaly.  No masses felt.No tenderness. Skin: Clear, No rashes noted.No worrisome skin lesions seen. Neurological: Grossly intact without focal findings, cranial nerves II through XII intact, muscle strength equal bilaterally Musculoskeletal: No acute joint abnormalities noted.Full range of movement noted with joints.  Specifically, examination of his spine shows tenderness in the spine around the region of L4/L5.  He also has some tenderness in the paramedian area in this area of L4/L5.   There are no neurological abnormalities. Psychiatric: Affect appropriate, non-anxious.    Assessment  1. Encounter for general adult medical examination with abnormal findings   2. Hypogonadism in male   3. Vitamin D deficiency disease   4. Acute midline low back pain without sciatica   5. Special screening for malignant neoplasm of prostate     Tests Ordered:   Orders Placed This Encounter  Procedures  . DG Lumbar Spine 2-3 Views  . PSA, Total with Reflex to PSA, Free     Plan  1. Healthy 34 year old man. 2. He will continue with testosterone therapy as before and his levels were in a good range previously and he is tolerating this higher dose. 3. He will continue with vitamin D3 supplementation for vitamin D deficiency. 4. As far as his back pain is concerned, I am going to send him for a lumbar spine x-ray for further evaluation. 5. Blood work is ordered and we only need to do a PSA today. 6. Today also discussed COVID-19 vaccination in more detail and answered all the questions he had.  I encouraged him to get vaccinated with COVID-19 vaccine. 7. Follow-up in 6 months.  Further recommendations will depend on blood results. 8. Today, in addition to a preventative visit, I independently performed an office visit to address his chronic conditions and acute symptoms above.     No orders of the defined types were placed in this encounter.    Waynesha Rammel C Adonis Ryther   07/09/2020, 10:19 AM

## 2020-07-10 LAB — PSA, TOTAL WITH REFLEX TO PSA, FREE: PSA, Total: 1.6 ng/mL (ref <=4.0)

## 2020-08-07 ENCOUNTER — Other Ambulatory Visit (INDEPENDENT_AMBULATORY_CARE_PROVIDER_SITE_OTHER): Payer: Self-pay | Admitting: Internal Medicine

## 2020-08-07 ENCOUNTER — Telehealth (INDEPENDENT_AMBULATORY_CARE_PROVIDER_SITE_OTHER): Payer: Self-pay | Admitting: Internal Medicine

## 2020-08-07 MED ORDER — TESTOSTERONE CYPIONATE 200 MG/ML IM SOLN: 160.0000 mg | INTRAMUSCULAR | 2 refills | Status: DC

## 2020-08-07 NOTE — Telephone Encounter (Signed)
Okay, I have just sent that prescription to [facility] now.  Thanks.

## 2020-09-17 ENCOUNTER — Other Ambulatory Visit (INDEPENDENT_AMBULATORY_CARE_PROVIDER_SITE_OTHER): Payer: Self-pay | Admitting: Internal Medicine

## 2020-09-17 ENCOUNTER — Telehealth (INDEPENDENT_AMBULATORY_CARE_PROVIDER_SITE_OTHER): Payer: Self-pay | Admitting: Internal Medicine

## 2020-09-17 MED ORDER — TESTOSTERONE CYPIONATE 200 MG/ML IM SOLN: 160.0000 mg | INTRAMUSCULAR | 2 refills | Status: DC

## 2020-09-18 NOTE — Telephone Encounter (Signed)
Done

## 2020-10-29 ENCOUNTER — Other Ambulatory Visit (INDEPENDENT_AMBULATORY_CARE_PROVIDER_SITE_OTHER): Payer: Self-pay | Admitting: Internal Medicine

## 2020-10-29 ENCOUNTER — Telehealth (INDEPENDENT_AMBULATORY_CARE_PROVIDER_SITE_OTHER): Payer: Self-pay

## 2020-10-29 MED ORDER — TESTOSTERONE CYPIONATE 200 MG/ML IM SOLN: 160.0000 mg | INTRAMUSCULAR | 2 refills | Status: DC

## 2020-10-29 NOTE — Telephone Encounter (Signed)
Patient called and stated that he needs a refill of the following medication:  testosterone cypionate (DEPO-TESTOSTERONE) 200 MG/ML injection  Last filled 09/16/2020, # 10 mL with 2 refills  Patient contacted [facility] and they told him to contact his PCP to have filled.

## 2020-12-10 ENCOUNTER — Telehealth (INDEPENDENT_AMBULATORY_CARE_PROVIDER_SITE_OTHER): Payer: Self-pay

## 2020-12-10 ENCOUNTER — Other Ambulatory Visit (INDEPENDENT_AMBULATORY_CARE_PROVIDER_SITE_OTHER): Payer: Self-pay | Admitting: Internal Medicine

## 2020-12-10 MED ORDER — TESTOSTERONE CYPIONATE 200 MG/ML IM SOLN: 160.0000 mg | INTRAMUSCULAR | 2 refills | Status: DC

## 2020-12-10 NOTE — Telephone Encounter (Signed)
Patient called and requested a refill for the following medication:  testosterone cypionate (DEPO-TESTOSTERONE) 200 MG/ML injection  Last filled 10/29/2020, # 10 mL with 2 refills  Last OV 07/09/2020  Next OV 01/07/2021

## 2020-12-31 ENCOUNTER — Telehealth (INDEPENDENT_AMBULATORY_CARE_PROVIDER_SITE_OTHER): Payer: Self-pay

## 2020-12-31 NOTE — Telephone Encounter (Signed)
Called patient and let him know that I have faxed in the prescriptions and I did receive a confirmation that these went through and to check with [facility] to make sure they received. Patient verbalized an understanding.

## 2020-12-31 NOTE — Telephone Encounter (Signed)
Patient called and left a voice message requesting a refill of his syringes and needles for his testosterone injections.  Please send refill for patient. Thanks!

## 2021-01-07 ENCOUNTER — Ambulatory Visit (INDEPENDENT_AMBULATORY_CARE_PROVIDER_SITE_OTHER): Payer: PRIVATE HEALTH INSURANCE | Admitting: Internal Medicine

## 2021-01-14 ENCOUNTER — Telehealth (INDEPENDENT_AMBULATORY_CARE_PROVIDER_SITE_OTHER): Payer: Self-pay

## 2021-01-14 ENCOUNTER — Other Ambulatory Visit (INDEPENDENT_AMBULATORY_CARE_PROVIDER_SITE_OTHER): Payer: Self-pay | Admitting: Internal Medicine

## 2021-01-14 MED ORDER — TESTOSTERONE CYPIONATE 200 MG/ML IM SOLN: 200.0000 mg | INTRAMUSCULAR | 2 refills | Status: DC

## 2021-01-14 NOTE — Telephone Encounter (Signed)
Called patient and gave him the information. Patient verbalized an understanding and thanked [place].

## 2021-01-14 NOTE — Telephone Encounter (Signed)
Okay,I have sent a new Rx with higher dose.

## 2021-01-14 NOTE — Telephone Encounter (Signed)
Patient called and stated that he is taking 0.85 Testosterone two times a week and was advised by Dr. Karilyn Cota to take this amount and patient is having trouble getting his prescription filled from the [facility].   Current prescription and dosage: testosterone cypionate (DEPO-TESTOSTERONE) 200 MG/ML injection  Inject 0.8 mLs (160 mg total) into the muscle 2 (two) times a week.   Patient stated that he is out, may have a tiny amount left in his vial and [facility] will not fill until April 16th.   I do not see a note in the system of this change and patient stated at his last appointment in October that this was supposed to have been update and he has been having trouble getting this filled. Patient does pay for this out of pocket due to cost of insurance.   Last OV 07/09/2020  Next OV 01/24/2021  Please advise as patient is worried that he will be out of his medication and not sure how to have this corrected.

## 2021-01-14 NOTE — Telephone Encounter (Signed)
Are you able to help with this? Please see message.

## 2021-01-24 ENCOUNTER — Ambulatory Visit (INDEPENDENT_AMBULATORY_CARE_PROVIDER_SITE_OTHER): Payer: BC Managed Care – PPO | Admitting: Internal Medicine

## 2021-01-24 ENCOUNTER — Other Ambulatory Visit: Payer: Self-pay

## 2021-01-24 ENCOUNTER — Encounter (INDEPENDENT_AMBULATORY_CARE_PROVIDER_SITE_OTHER): Payer: Self-pay | Admitting: Internal Medicine

## 2021-01-24 VITALS — BP 122/84 | HR 98 | Temp 97.9°F | Resp 18 | Ht 73.0 in | Wt 234.0 lb

## 2021-01-24 DIAGNOSIS — R5381 Other malaise: Secondary | ICD-10-CM

## 2021-01-24 DIAGNOSIS — E559 Vitamin D deficiency, unspecified: Secondary | ICD-10-CM | POA: Diagnosis not present

## 2021-01-24 DIAGNOSIS — R0609 Other forms of dyspnea: Secondary | ICD-10-CM

## 2021-01-24 DIAGNOSIS — R0600 Dyspnea, unspecified: Secondary | ICD-10-CM

## 2021-01-24 DIAGNOSIS — E291 Testicular hypofunction: Secondary | ICD-10-CM

## 2021-01-24 DIAGNOSIS — R5383 Other fatigue: Secondary | ICD-10-CM

## 2021-01-24 NOTE — Progress Notes (Signed)
Metrics: Intervention Frequency ACO  Documented Smoking Status Yearly  Screened one or more times in 24 months  Cessation Counseling or  Active cessation medication Past 24 months  Past 24 months   Guideline developer: UpToDate (See UpToDate for funding source) Date Released: 2014       Wellness Office Visit  Subjective:  Patient ID: Joshua Tanner, male    DOB: 1986-08-03  Age: 35 y.o. MRN: [id number]  CC: This man comes in for follow-up regarding his hypogonadism, vitamin D deficiency. HPI  He is also complaining of significant fatigue during the day and also dyspnea on exertion.  The symptoms of been present for about the last month. 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 He denies any chest pain on exertion. Past Medical History:  Diagnosis Date  . Anxiety   . Hypogonadism in male    Past Surgical History:  Procedure Laterality Date  . VASECTOMY    . WISDOM TOOTH EXTRACTION       Family History  Problem Relation Age of Onset  . Obesity Mother   . Diabetes Father   . Obesity Father   . Hypertension Father   . Other Neg Hx        low testosterone  . 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   Social History   Tobacco Use  . Smoking status: Never Smoker  . Smokeless tobacco: Current User    Types: Chew  Substance Use Topics  . Alcohol use: Yes    Alcohol/week: 18.0 standard drinks    Types: 18 Cans of beer per week    Current Meds  Medication Sig  . Cholecalciferol (VITAMIN D3) 1.25 MG (50000 UT) CAPS Take 10,000 Units by mouth daily.  . Multiple Vitamin (MULTIVITAMIN ADULT PO) Take by mouth daily.  . Omega-3 Fatty Acids (FISH OIL) 1000 MG CAPS Take 1,000 mg by mouth daily.  Marland Kitchen testosterone cypionate (DEPO-TESTOSTERONE) 200 MG/ML injection Inject 1 mL (200 mg total)  into the muscle 2 (two) times a week. NEW DOSE  . TUBERCULIN SYR 1CC/25GX5/8" 25G X 5/8" 1 ML MISC      Flowsheet Row Office Visit from 01/24/2021 in [facility]  PHQ-9 Total Score 9      Objective:   Today's Vitals: BP 122/84 (BP Location: Right Arm, Patient Position: Sitting, Cuff Size: Normal)   Pulse 98   Temp 97.9 F (36.6 C) (Temporal)   Resp 18   Ht 6\' 1"  (1.854 m)   Wt 234 lb (106.1 kg)   SpO2 99%   BMI 30.87 kg/m  Vitals with BMI 01/24/2021 07/09/2020 02/08/2020  Height 6\' 1"  6\' 1"  6\' 1"   Weight 234 lbs 221 lbs 10 oz 239 lbs 3 oz  BMI 30.88 29.24 31.57  Systolic 122 132 04/09/2020  Diastolic 84 70 90  Pulse 98 78 67     Physical Exam He looks systemically well.  No new physical findings.      Assessment   1. Vitamin D deficiency disease   2. Malaise and fatigue   3. Hypogonadism in male   4. Dyspnea on exertion       Tests ordered Orders Placed This Encounter  Procedures  . DHEA-sulfate  . EKG 12-Lead  . EKG 12-Lead  .  ECHOCARDIOGRAM COMPLETE     Plan: 1. An ECG was done in the office which shows sinus rhythm without any acute ST-T wave changes.  I will organize an echocardiogram in case there is some abnormality here that would explain his dyspnea on exertion.  I think a lot of his problems are related to stress. 2. Continue with testosterone therapy as before. 3. Check DHEA level to see if this is an avenue for improving his energy levels. 4. Testosterone levels are in a good range and he should continue with the same dose of testosterone. 5. I will see him in about 6 months for an annual physical exam.  I spent about 30 minutes with this patient explaining and discussing his symptoms and answering all his questions.   No orders of the defined types were placed in this encounter.   Wilson Singer, MD

## 2021-02-04 ENCOUNTER — Telehealth (INDEPENDENT_AMBULATORY_CARE_PROVIDER_SITE_OTHER): Payer: Self-pay

## 2021-02-04 ENCOUNTER — Encounter (INDEPENDENT_AMBULATORY_CARE_PROVIDER_SITE_OTHER): Payer: Self-pay

## 2021-02-04 NOTE — Telephone Encounter (Signed)
The only blood work I want him to have is DHEA levels and the order has already placed so he needs to come in and get this blood work done.

## 2021-02-04 NOTE — Telephone Encounter (Signed)
Patient called and stated that he thought he was due to have several labs done and I did not see any orders for labs. Can you order the labs and I will call the patient back to schedule his lab appointment? He stated that he can come first thing on a morning and come fasting.  Nellie is working on his echocardiogram to have scheduled also.

## 2021-02-04 NOTE — Telephone Encounter (Signed)
Called patient and scheduled him for lab appointment on 02/14/2021 and gave him the number to call to reschedule his echocardiogram. Patient verbalized an understanding.

## 2021-02-05 ENCOUNTER — Encounter (HOSPITAL_COMMUNITY): Payer: BC Managed Care – PPO

## 2021-02-14 ENCOUNTER — Ambulatory Visit (HOSPITAL_COMMUNITY)
Admission: RE | Admit: 2021-02-14 | Discharge: 2021-02-14 | Disposition: A | Discharge status: Home / Self Care | Payer: BC Managed Care – PPO | Source: Ambulatory Visit | Attending: Internal Medicine | Admitting: Internal Medicine

## 2021-02-14 ENCOUNTER — Other Ambulatory Visit: Payer: Self-pay

## 2021-02-14 ENCOUNTER — Other Ambulatory Visit (INDEPENDENT_AMBULATORY_CARE_PROVIDER_SITE_OTHER): Payer: BC Managed Care – PPO

## 2021-02-14 DIAGNOSIS — R0600 Dyspnea, unspecified: Secondary | ICD-10-CM | POA: Insufficient documentation

## 2021-02-14 DIAGNOSIS — R5383 Other fatigue: Secondary | ICD-10-CM | POA: Diagnosis not present

## 2021-02-14 DIAGNOSIS — R5381 Other malaise: Secondary | ICD-10-CM | POA: Diagnosis not present

## 2021-02-15 LAB — DHEA-SULFATE: DHEA-SO4: 262 ug/dL (ref 93–415)

## 2021-05-01 ENCOUNTER — Ambulatory Visit (INDEPENDENT_AMBULATORY_CARE_PROVIDER_SITE_OTHER): Payer: BC Managed Care – PPO

## 2021-05-01 ENCOUNTER — Other Ambulatory Visit (INDEPENDENT_AMBULATORY_CARE_PROVIDER_SITE_OTHER): Payer: Self-pay | Admitting: Nurse Practitioner

## 2021-05-01 ENCOUNTER — Other Ambulatory Visit: Payer: Self-pay

## 2021-05-01 DIAGNOSIS — Z23 Encounter for immunization: Secondary | ICD-10-CM | POA: Diagnosis not present

## 2021-05-01 DIAGNOSIS — Z0184 Encounter for antibody response examination: Secondary | ICD-10-CM

## 2021-05-01 DIAGNOSIS — Z111 Encounter for screening for respiratory tuberculosis: Secondary | ICD-10-CM

## 2021-05-01 NOTE — Progress Notes (Signed)
Patient arrived today with form for work that needs to be filled out.  I do see that he had a documented physical with Dr. Karilyn Cota within the last year so I am filling this out on Dr. Patty Sermons behalf.  We do not have patient's immunization records so patient is going to try to get these records to [place] so that I can sign off on whether or not he is current with recommended immunizations.  We will also order tuberculosis test today.  In the event that his immunization record cannot be provided to [place] we will check for immunity via blood titers today.  Will sign off on form once all results have come back and once records have been provided.  I also performed a vision and hearing screening test today.  Patient's vision was 20/20 in both eyes, he does wear contacts.  Also did whisper hearing screening test that he passed in both ears.

## 2021-05-01 NOTE — Progress Notes (Signed)
Patient came in for Tdap and was given in Left Deltoid. Patient tolerated injection well.

## 2021-05-03 ENCOUNTER — Encounter: Payer: Self-pay | Admitting: Emergency Medicine

## 2021-05-03 ENCOUNTER — Other Ambulatory Visit: Payer: Self-pay

## 2021-05-03 ENCOUNTER — Ambulatory Visit
Admission: EM | Admit: 2021-05-03 | Discharge: 2021-05-03 | Disposition: A | Discharge status: Home / Self Care | Payer: BC Managed Care – PPO | Attending: Emergency Medicine | Admitting: Emergency Medicine

## 2021-05-03 DIAGNOSIS — R0981 Nasal congestion: Secondary | ICD-10-CM | POA: Diagnosis not present

## 2021-05-03 MED ORDER — AMOXICILLIN-POT CLAVULANATE 875-125 MG PO TABS: 1.0000 | ORAL_TABLET | Freq: Two times a day (BID) | ORAL | 0 refills | Status: AC

## 2021-05-03 NOTE — ED Triage Notes (Signed)
Nasal and chest congestion with green sputum.

## 2021-05-03 NOTE — Discharge Instructions (Addendum)
Rest and push fluids Augmentin prescribed.  Take as directed and to completion Continue with OTC ibuprofen/tylenol as needed for pain Follow up with PCP or [facility] if symptoms persists Return or go to the ED if you have any new or worsening symptoms such as fever, chills, worsening sinus pain/pressure, cough, sore throat, chest pain, shortness of breath, abdominal pain, changes in bowel or bladder habits, etc...  

## 2021-05-03 NOTE — ED Provider Notes (Signed)
[facility]   [id number] 05/03/21 Arrival Time: 5643   CC: Sinus infection  SUBJECTIVE: History from: patient.  Aron Inge is a 35 y.o. male who presents with nasal and chest congestion x 1 week  Denies sick exposure to COVID, flu or strep.  Has tried OTC medications without relief.  Symptoms are made worse with at night.  Reports previous symptoms in the past with sinus infection.   Denies fever, chills, SOB, wheezing, chest pain, nausea, changes in bowel or bladder habits.     ROS: As per HPI.  All other pertinent ROS negative.     Past Medical History:  Diagnosis Date   Anxiety    Hypogonadism in male    Past Surgical History:  Procedure Laterality Date   VASECTOMY     WISDOM TOOTH EXTRACTION     Allergies  Allergen Reactions   Latex Rash    Rash & itching   Codeine    No current facility-administered medications on file prior to encounter.   Current Outpatient Medications on File Prior to Encounter  Medication Sig Dispense Refill   Cholecalciferol (VITAMIN D3) 1.25 MG (50000 UT) CAPS Take 10,000 Units by mouth daily.     Multiple Vitamin (MULTIVITAMIN ADULT PO) Take by mouth daily.     Omega-3 Fatty Acids (FISH OIL) 1000 MG CAPS Take 1,000 mg by mouth daily.     testosterone cypionate (DEPO-TESTOSTERONE) 200 MG/ML injection Inject 1 mL (200 mg total) into the muscle 2 (two) times a week. NEW DOSE 10 mL 2   TUBERCULIN SYR 1CC/25GX5/8" 25G X 5/8" 1 ML MISC      Social History   Socioeconomic History   Marital status: Unknown    Spouse name: Not on file   Number of children: Not on file   Years of education: Not on file   Highest education level: Not on file  Occupational History   Not on file  Tobacco Use   Smoking status: Never   Smokeless tobacco: Current    Types: Chew  Substance and Sexual Activity   Alcohol use: Yes    Alcohol/week: 18.0 standard drinks    Types: 18 Cans of beer per week   Drug use: No   Sexual activity: Yes  Other  Topics Concern   Not on file  Social History Narrative   Married for 14 years.Lives with wife and 3 kids.[name] since 2008.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   Social Determinants of Health   Financial Resource Strain: Not on file  Food Insecurity: Not on file  Transportation Needs: Not on file  Physical Activity: Not on file  Stress: Not on file  Social Connections: Not on file  Intimate Partner Violence: Not on file   Family History  Problem Relation Age of Onset   Obesity Mother    Diabetes Father    Obesity Father    Hypertension Father    Other Neg Hx        low testosterone   Colon cancer Neg Hx     OBJECTIVE:  Vitals:   05/03/21 0819  BP: (!) 153/98  Pulse: 91  Resp: 16  Temp: 98 F (36.7 C)  TempSrc: Oral  SpO2: 98%     General appearance: alert; appears fatigued, but nontoxic; speaking in full sentences and tolerating own secretions HEENT: NCAT; Ears: EACs clear, TMs pearly gray; Eyes: PERRL.  EOM grossly intact. Nose: nares patent without rhinorrhea, Throat: oropharynx clear, tonsils non erythematous or enlarged, uvula  midline  Neck: supple without LAD Lungs: unlabored respirations, symmetrical air entry; cough: absent; no respiratory distress; CTAB Heart: regular rate and rhythm.   Skin: warm and dry Psychological: alert and cooperative; normal mood and affect   ASSESSMENT & PLAN:  1. Sinus congestion     Meds ordered this encounter  Medications   amoxicillin-clavulanate (AUGMENTIN) 875-125 MG tablet    Sig: Take 1 tablet by mouth every 12 (twelve) hours for 10 days.    Dispense:  20 tablet    Refill:  0    Order Specific Question:   Supervising Provider    Answer:   Eustace Bradham [[id number]]    Rest and push fluids Augmentin prescribed.  Take as directed and to completion Continue with OTC ibuprofen/tylenol as needed for pain Follow up with PCP or [facility] if symptoms persists Return or go to the ED if you have any  new or worsening symptoms such as fever, chills, worsening sinus pain/pressure, cough, sore throat, chest pain, shortness of breath, abdominal pain, changes in bowel or bladder habits, etc...   Reviewed expectations re: course of current medical issues. Questions answered. Outlined signs and symptoms indicating need for more acute intervention. Patient verbalized understanding. After Visit Summary given.          Alvino Chapel [place], PA-C 05/03/21 [phone]

## 2021-05-04 LAB — QUANTIFERON-TB GOLD PLUS
Mitogen-NIL: 3.76 IU/mL
NIL: 0.03 IU/mL
QuantiFERON-TB Gold Plus: NEGATIVE
TB1-NIL: 0.02 IU/mL
TB2-NIL: 0.01 IU/mL

## 2021-05-04 LAB — MEASLES/MUMPS/RUBELLA IMMUNITY
Mumps IgG: 45.4 AU/mL
Rubella: 8.1 Index
Rubeola IgG: 220 AU/mL

## 2021-05-04 LAB — HEPATITIS B SURFACE ANTIGEN: Hepatitis B Surface Ag: NONREACTIVE

## 2021-05-04 LAB — HEPATITIS B SURFACE ANTIBODY,QUALITATIVE: Hep B S Ab: REACTIVE — AB

## 2021-05-06 ENCOUNTER — Telehealth (INDEPENDENT_AMBULATORY_CARE_PROVIDER_SITE_OTHER): Payer: Self-pay | Admitting: Nurse Practitioner

## 2021-05-06 NOTE — Telephone Encounter (Signed)
Please collect this patient's paperwork for his job from my desk. It is in a manilla folder on the right side of my desk next to the wall. Call the patient and let him know that his Tb testing was negative and recommended immunity testing looks good so he can pick his paperwork up for his job. Please let me know if you have any questions.

## 2021-05-06 NOTE — Telephone Encounter (Signed)
I have called and left a message on his cell phone and I also emailed him the form and will put the original in the mail to him.

## 2021-05-10 ENCOUNTER — Other Ambulatory Visit (INDEPENDENT_AMBULATORY_CARE_PROVIDER_SITE_OTHER): Payer: Self-pay | Admitting: Internal Medicine

## 2021-05-10 DIAGNOSIS — E291 Testicular hypofunction: Secondary | ICD-10-CM

## 2021-05-13 ENCOUNTER — Telehealth (INDEPENDENT_AMBULATORY_CARE_PROVIDER_SITE_OTHER): Payer: Self-pay

## 2021-05-13 NOTE — Telephone Encounter (Signed)
Patient called and stated that he has been out of his Testosterone for over a week and he is having severe fatigue and having bad mood swings and he is not feeling well and wanted to know if you are able to send something in to [facility]? He does have an appointment coming up for [facility] and he is also starting his new job and is very worried because he is very emotional and not sure what to do to help him. Please advise.

## 2021-05-15 NOTE — Telephone Encounter (Signed)
Please call patient let him know that I did a chart review on this patient and see that when he came to see Dr. Karilyn Cota originally in March 2020 he had a history of low testosterone levels.  For this reason I will refill his testosterone.  However, last blood work which was done slightly over a year ago shows supratherapeutic testosterone levels so I feel more comfortable putting him on a smaller dose.  I have provided instructions on his new label as far as tapering his dose down.  I recommend that he inject 1 mL once a week for 2 weeks, then 0.5 ML once a week for 2 weeks, then 0.5 ML every 14 days.  He should continue to follow-up with [facility] for additional assistance with managing his hormone therapy.  I could also refer him to endocrinology if he would like, per chart review I see that he has seen endocrinologist in the past.  Let me know if you would like referral to endocrinology.  Thank you.  Prescription was sent to [facility].

## 2021-05-15 NOTE — Telephone Encounter (Signed)
Called patient and gave him the message. Patient stated that he was able to be seen by [facility] yesterday and they sent in his testosterone and I advised him to get what they prescribed and not what Sarah prescribed. Patient verbalized an understanding and thanked [place].

## 2021-07-29 ENCOUNTER — Encounter (INDEPENDENT_AMBULATORY_CARE_PROVIDER_SITE_OTHER): Payer: BC Managed Care – PPO | Admitting: Internal Medicine
